# Patient Record
Sex: Male | Born: 1965 | Race: Asian | Hispanic: No | Smoking: Former smoker
Health system: Southern US, Community
[De-identification: ages and names within clinical notes are randomized; demographics above are authoritative.]

## PROBLEM LIST (undated history)

## (undated) DIAGNOSIS — S53104A Unspecified dislocation of right ulnohumeral joint, initial encounter: Secondary | ICD-10-CM

## (undated) DIAGNOSIS — S45101A Unspecified injury of brachial artery, right side, initial encounter: Secondary | ICD-10-CM

---

## 2000-12-07 ENCOUNTER — Encounter: Admission: RE | Admit: 2000-12-07 | Discharge: 2001-03-07 | Payer: Self-pay | Admitting: Emergency Medicine

## 2001-01-11 ENCOUNTER — Ambulatory Visit (HOSPITAL_COMMUNITY): Admission: RE | Admit: 2001-01-11 | Discharge: 2001-01-11 | Payer: Self-pay | Admitting: *Deleted

## 2014-10-23 ENCOUNTER — Ambulatory Visit (INDEPENDENT_AMBULATORY_CARE_PROVIDER_SITE_OTHER): Payer: 59 | Admitting: Emergency Medicine

## 2014-10-23 VITALS — BP 120/80 | HR 98 | Temp 98.0°F | Resp 16 | Ht 67.0 in | Wt 117.0 lb

## 2014-10-23 DIAGNOSIS — B001 Herpesviral vesicular dermatitis: Secondary | ICD-10-CM

## 2014-10-23 DIAGNOSIS — Z1211 Encounter for screening for malignant neoplasm of colon: Secondary | ICD-10-CM

## 2014-10-23 DIAGNOSIS — F524 Premature ejaculation: Secondary | ICD-10-CM

## 2014-10-23 DIAGNOSIS — Z0000 Encounter for general adult medical examination without abnormal findings: Secondary | ICD-10-CM

## 2014-10-23 LAB — CBC WITH DIFFERENTIAL/PLATELET
Basophils Absolute: 0.2 10*3/uL — ABNORMAL HIGH (ref 0.0–0.1)
Basophils Relative: 2 % — ABNORMAL HIGH (ref 0–1)
Eosinophils Absolute: 1 10*3/uL — ABNORMAL HIGH (ref 0.0–0.7)
Eosinophils Relative: 13 % — ABNORMAL HIGH (ref 0–5)
HCT: 40.1 % (ref 39.0–52.0)
Hemoglobin: 13.3 g/dL (ref 13.0–17.0)
Lymphocytes Relative: 37 % (ref 12–46)
Lymphs Abs: 2.8 10*3/uL (ref 0.7–4.0)
MCH: 26.9 pg (ref 26.0–34.0)
MCHC: 33.2 g/dL (ref 30.0–36.0)
MCV: 81.2 fL (ref 78.0–100.0)
MPV: 10.2 fL (ref 8.6–12.4)
Monocytes Absolute: 0.9 10*3/uL (ref 0.1–1.0)
Monocytes Relative: 12 % (ref 3–12)
Neutro Abs: 2.7 10*3/uL (ref 1.7–7.7)
Neutrophils Relative %: 36 % — ABNORMAL LOW (ref 43–77)
Platelets: 345 10*3/uL (ref 150–400)
RBC: 4.94 MIL/uL (ref 4.22–5.81)
RDW: 13.7 % (ref 11.5–15.5)
WBC: 7.5 10*3/uL (ref 4.0–10.5)

## 2014-10-23 LAB — LIPID PANEL
Cholesterol: 218 mg/dL — ABNORMAL HIGH (ref 0–200)
HDL: 46 mg/dL (ref >=40)
LDL Cholesterol: 152 mg/dL — ABNORMAL HIGH (ref 0–99)
Total CHOL/HDL Ratio: 4.7 Ratio
Triglycerides: 100 mg/dL (ref <150)
VLDL: 20 mg/dL (ref 0–40)

## 2014-10-23 LAB — COMPREHENSIVE METABOLIC PANEL
ALT: 15 U/L (ref 0–53)
AST: 23 U/L (ref 0–37)
Albumin: 4.6 g/dL (ref 3.5–5.2)
Alkaline Phosphatase: 67 U/L (ref 39–117)
BUN: 15 mg/dL (ref 6–23)
CO2: 26 mEq/L (ref 19–32)
Calcium: 9.9 mg/dL (ref 8.4–10.5)
Chloride: 105 mEq/L (ref 96–112)
Creat: 0.88 mg/dL (ref 0.50–1.35)
Glucose, Bld: 100 mg/dL — ABNORMAL HIGH (ref 70–99)
Potassium: 4.6 mEq/L (ref 3.5–5.3)
Sodium: 142 mEq/L (ref 135–145)
Total Bilirubin: 0.5 mg/dL (ref 0.2–1.2)
Total Protein: 8.3 g/dL (ref 6.0–8.3)

## 2014-10-23 LAB — POCT URINALYSIS DIPSTICK
Bilirubin, UA: NEGATIVE
Blood, UA: NEGATIVE
Glucose, UA: NEGATIVE
Ketones, UA: 15
Leukocytes, UA: NEGATIVE
Nitrite, UA: NEGATIVE
Spec Grav, UA: 1.025
Urobilinogen, UA: 0.2
pH, UA: 5.5

## 2014-10-23 LAB — TSH: TSH: 0.115 u[IU]/mL — ABNORMAL LOW (ref 0.350–4.500)

## 2014-10-23 MED ORDER — PAROXETINE HCL 20 MG PO TABS: 20.0000 mg | ORAL_TABLET | Freq: Every day | ORAL | Status: DC

## 2014-10-23 MED ORDER — VALACYCLOVIR HCL 1 G PO TABS: 1000.0000 mg | ORAL_TABLET | Freq: Every day | ORAL | Status: DC

## 2014-10-23 NOTE — Patient Instructions (Signed)
Cold Sore  A cold sore (fever blister) is a skin infection caused by the herpes simplex virus (HSV-1). HSV-1 is closely related to the virus that causes genital herpes (HSV-2), but they are not the same even though both viruses can cause oral and genital infections. Cold sores are small, fluid-filled sores inside of the mouth or on the lips, gums, nose, chin, cheeks, or fingers.   The herpes simplex virus can be easily passed (contagious) to other people through close personal contact, such as kissing or sharing personal items. The virus can also spread to other parts of the body, such as the eyes or genitals. Cold sores are contagious until the sores crust over completely. They often heal within 2 weeks.   Once a person is infected, the herpes simplex virus remains permanently in the body. Therefore, there is no cure for cold sores, and they often recur when a person is tired, stressed, sick, or gets too much sun. Additional factors that can cause a recurrence include hormone changes in menstruation or pregnancy, certain drugs, and cold weather.   CAUSES   Cold sores are caused by the herpes simplex virus. The virus is spread from person to person through close contact, such as through kissing, touching the affected area, or sharing personal items such as lip balm, razors, or eating utensils.   SYMPTOMS   The first infection may not cause symptoms. If symptoms develop, the symptoms often go through different stages. Here is how a cold sore develops:   · Tingling, itching, or burning is felt 1-2 days before the outbreak.    · Fluid-filled blisters appear on the lips, inside the mouth, nose, or on the cheeks.    · The blisters start to ooze clear fluid.    · The blisters dry up and a yellow crust appears in its place.    · The crust falls off.    Symptoms depend on whether it is the initial outbreak or a recurrence. Some other symptoms with the first outbreak may include:   · Fever.    · Sore throat.    · Headache.     · Muscle aches.    · Swollen neck glands.    DIAGNOSIS   A diagnosis is often made based on your symptoms and looking at the sores. Sometimes, a sore may be swabbed and then examined in the lab to make a final diagnosis. If the sores are not present, blood tests can find the herpes simplex virus.   TREATMENT   There is no cure for cold sores and no vaccine for the herpes simplex virus. Within 2 weeks, most cold sores go away on their own without treatment. Medicines cannot make the infection go away, but medicine can help relieve some of the pain associated with the sores, can work to stop the virus from multiplying, and can also shorten healing time. Medicine may be in the form of creams, gels, pills, or a shot.   HOME CARE INSTRUCTIONS   · Only take over-the-counter or prescription medicines for pain, discomfort, or fever as directed by your caregiver. Do not use aspirin.    · Use a cotton-tip swab to apply creams or gels to your sores.    · Do not touch the sores or pick the scabs. Wash your hands often. Do not touch your eyes without washing your hands first.    · Avoid kissing, oral sex, and sharing personal items until sores heal.    · Apply an ice pack on your sores for 10-15 minutes to ease any discomfort.    ·   Avoid hot, cold, or salty foods because they may hurt your mouth. Eat a soft, bland diet to avoid irritating the sores. Use a straw to drink if you have pain when drinking out of a glass.    · Keep sores clean and dry to prevent an infection of other tissues.    · Avoid the sun and limit stress if these things trigger outbreaks. If sun causes cold sores, apply sunscreen on the lips before being out in the sun.    SEEK MEDICAL CARE IF:   · You have a fever or persistent symptoms for more than 2-3 days.    · You have a fever and your symptoms suddenly get worse.    · You have pus, not clear fluid, coming from the sores.    · You have redness that is spreading.    · You have pain or irritation in your  eye.    · You get sores on your genitals.    · Your sores do not heal within 2 weeks.    · You have a weakened immune system.    · You have frequent recurrences of cold sores.    MAKE SURE YOU:   · Understand these instructions.  · Will watch your condition.  · Will get help right away if you are not doing well or get worse.  Document Released: 08/14/2000 Document Revised: 01/01/2014 Document Reviewed: 12/30/2011  ExitCare® Patient Information ©2015 [employer]. This information is not intended to replace advice given to you by your health care provider. Make sure you discuss any questions you have with your health care provider.

## 2014-10-23 NOTE — Progress Notes (Signed)
[facility] [address], [place] [id number] [phone]  Date:  10/23/2014   Name:  Daniel Stewart   DOB:  [birth year]   MRN:  [id number]  PCP:  No primary care provider on file.    Chief Complaint: Annual Exam and Mouth Lesions   History of Present Illness:  Daniel Stewart is a 49 y.o. very pleasant male patient who presents with the following:  For routine physical Only current complaint is oral ulcer that has recurrent since he stopped smoking. Needs colon cancer screening. Has premature ejaculation   There are no active problems to display for this patient.   History reviewed. No pertinent past medical history.  History reviewed. No pertinent past surgical history.  History  Substance Use Topics  . Smoking status: Former Games developermoker  . Smokeless tobacco: Not on file  . Alcohol Use: Not on file    Family History  Problem Relation Age of Onset  . Diabetes Mother   . Cancer Father     No Known Allergies  Medication list has been reviewed and updated.  No current outpatient prescriptions on file prior to visit.   No current facility-administered medications on file prior to visit.    Review of Systems:  As per HPI, otherwise negative.  Marland Kitchen.  Physical Examination: Filed Vitals:   10/23/14 0923  BP: 120/80  Pulse: 98  Temp: 98 F (36.7 C)  Resp: 16   Filed Vitals:   10/23/14 0923  Height: 5\' 7"  (1.702 m)  Weight: 117 lb (53.071 kg)   Body mass index is 18.32 kg/(m^2). Ideal Body Weight: Weight in (lb) to have BMI = 25: 159.3  GEN: WDWN, NAD, Non-toxic, A & O x 3 HEENT: Atraumatic, Normocephalic. Neck supple. No masses, No LAD. Ears and Nose: No external deformity. CV: RRR, No M/G/R. No JVD. No thrill. No extra heart sounds. PULM: CTA B, no wheezes, crackles, rhonchi. No retractions. No resp. distress. No accessory muscle use. ABD: S, NT, ND, +BS. No rebound. No HSM. EXTR: No c/c/e NEURO Normal gait.   PSYCH: Normally interactive. Conversant. Not depressed or anxious appearing.  Calm demeanor.    Assessment and Plan: Annual physical Colon screening Premature ejaculation Cold sores  Signed,  Phillips OdorJeffery Vandora Jaskulski, MD

## 2014-10-23 NOTE — Addendum Note (Signed)
Addended by: Johnnette LitterARDWELL, Mehar Kirkwood M on: 10/23/2014 10:08 AM   Modules accepted: Kipp BroodSmartSet

## 2014-10-24 LAB — PSA: PSA: 0.98 ng/mL (ref <=4.00)

## 2014-11-26 ENCOUNTER — Encounter: Payer: Self-pay | Admitting: Internal Medicine

## 2015-03-02 ENCOUNTER — Ambulatory Visit (INDEPENDENT_AMBULATORY_CARE_PROVIDER_SITE_OTHER): Payer: 59 | Admitting: Physician Assistant

## 2015-03-02 VITALS — BP 126/82 | HR 78 | Temp 97.8°F | Resp 17 | Ht 68.0 in | Wt 119.8 lb

## 2015-03-02 DIAGNOSIS — Z760 Encounter for issue of repeat prescription: Secondary | ICD-10-CM

## 2015-03-02 MED ORDER — VALACYCLOVIR HCL 500 MG PO TABS: 500.0000 mg | ORAL_TABLET | Freq: Every day | ORAL | Status: DC

## 2015-03-02 NOTE — Progress Notes (Signed)
   03/02/2015 at 11:21 AM  Daniel Stewart / DOB: [birth year] / MRN: [id number]  The patient  does not have a problem list on file.  SUBJECTIVE  Chief complaint: Follow-up and Medication Refill  Patient here for medication refills today and he denies any complaints.  He reports he has been taking Valacyclovir for oral lesions, and says that this helps him a great deal, as when he is taking the medication he has no oral lesions.  He would like this refilled today.    He  has no past medical history on file.    Medications reviewed and updated by myself where necessary, and exist elsewhere in the encounter.   Daniel Stewart has No Known Allergies. He  reports that he has quit smoking. He does not have any smokeless tobacco history on file. He  has no sexual activity history on file. The patient  has no past surgical history on file.  His family history includes Cancer in his father; Diabetes in his mother.  Review of Systems  Constitutional: Negative for fever.  Skin: Negative for rash.  Neurological: Negative for dizziness and headaches.    OBJECTIVE  His  height is 5\' 8"  (1.727 m) and weight is 119 lb 12.8 oz (54.341 kg). His oral temperature is 97.8 F (36.6 C). His blood pressure is 126/82 and his pulse is 78. His respiration is 17 and oxygen saturation is 98%.  The patient's body mass index is 18.22 kg/(m^2).  Physical Exam  Constitutional: Vital signs are normal. He appears well-developed and well-nourished. No distress.  Skin: He is not diaphoretic.    No results found for this or any previous visit (from the past 24 hour(s)).  ASSESSMENT & PLAN  Daniel Stewart was seen today for follow-up and medication refill.  Diagnoses and all orders for this visit:  Medication refill Orders: -     valACYclovir (VALTREX) 500 MG tablet; Take 1 tablet (500 mg total) by mouth daily.   The patient was advised to call or come back to clinic if he does not see an improvement in  symptoms, or worsens with the above plan.   Deliah BostonMichael Clark, MHS, PA-C [facility] 03/02/2015 11:21 AM

## 2015-07-18 ENCOUNTER — Other Ambulatory Visit: Payer: Self-pay | Admitting: Physician Assistant

## 2015-07-18 ENCOUNTER — Telehealth: Payer: Self-pay

## 2015-07-18 MED ORDER — ACYCLOVIR 200 MG PO CAPS: 200.0000 mg | ORAL_CAPSULE | Freq: Three times a day (TID) | ORAL | Status: DC

## 2015-07-18 NOTE — Telephone Encounter (Signed)
Acyclovir written and sent.  Thanks Britta MccreedyBarbara.  Deliah BostonMichael Lester Crickenberger, MS, PA-C 10:30 AM, 07/18/2015

## 2015-07-18 NOTE — Telephone Encounter (Signed)
Pharm faxed notice that pt's ins is not covering valtrex, but will cover acyclovir. Casimiro NeedleMichael, do you want to write this for him to try?

## 2015-11-06 ENCOUNTER — Ambulatory Visit (INDEPENDENT_AMBULATORY_CARE_PROVIDER_SITE_OTHER): Payer: 59 | Admitting: Family Medicine

## 2015-11-06 VITALS — BP 130/70 | HR 102 | Temp 99.4°F | Resp 14 | Ht 68.0 in | Wt 119.0 lb

## 2015-11-06 DIAGNOSIS — J111 Influenza due to unidentified influenza virus with other respiratory manifestations: Secondary | ICD-10-CM

## 2015-11-06 DIAGNOSIS — K120 Recurrent oral aphthae: Secondary | ICD-10-CM | POA: Diagnosis not present

## 2015-11-06 DIAGNOSIS — K1379 Other lesions of oral mucosa: Secondary | ICD-10-CM

## 2015-11-06 MED ORDER — OSELTAMIVIR PHOSPHATE 75 MG PO CAPS: 75.0000 mg | ORAL_CAPSULE | Freq: Two times a day (BID) | ORAL | Status: DC

## 2015-11-06 MED ORDER — HYDROCOD POLST-CPM POLST ER 10-8 MG/5ML PO SUER: 5.0000 mL | Freq: Two times a day (BID) | ORAL | Status: DC | PRN

## 2015-11-06 MED ORDER — MAGIC MOUTHWASH W/LIDOCAINE: 10.0000 mL | ORAL | Status: DC | PRN

## 2015-11-06 MED ORDER — CHLORHEXIDINE GLUCONATE 0.12 % MT SOLN: 15.0000 mL | Freq: Four times a day (QID) | OROMUCOSAL | Status: DC

## 2015-11-06 MED ORDER — LIDOCAINE VISCOUS 2 % MT SOLN: 5.0000 mL | OROMUCOSAL | Status: DC | PRN

## 2015-11-06 MED ORDER — IPRATROPIUM BROMIDE 0.03 % NA SOLN: 2.0000 | Freq: Four times a day (QID) | NASAL | Status: DC

## 2015-11-06 NOTE — Progress Notes (Signed)
Subjective:  By signing my name below, I, Daniel Stewart, attest that this documentation has been prepared under the direction and in the presence of Norberto Sorenson, MD Electronically Signed: Charline Bills, ED Scribe 11/06/2015 at 11:00 AM.   Patient ID: Daniel Stewart, male    DOB: 05-15-1966, 50 y.o.   MRN: [id number] Chief Complaint  Patient presents with  . Cough    started yesterday  . Chills  . Generalized Body Aches   HPI  HPI Comments: Daniel Stewart is a 50 y.o. male who [facility] complaining of dry cough onset yesterday. Pt states that cough wakes him at night.Pt reports associated fever, chills, rhinorrhea, generalized body aches. Triage temperature 99.4 F. He has tried Theraflu without significant relief. Pt states influenza is going around at work.   Recurrent Mouth Sores Pt was seen in the office on 10/23/14, diagnosed with cold sores and started on valtrex and switch to acyclovir. He states that he has been compliant with medications but has noticed tender recurrent mouth sores. Pain is exacerbated with palpation. Pt states that he has not been seen by a dentist in a while.   History reviewed. No pertinent past medical history. Current Outpatient Prescriptions on File Prior to Visit  Medication Sig Dispense Refill  . acyclovir (ZOVIRAX) 200 MG capsule Take 1 capsule (200 mg total) by mouth 3 (three) times daily. Use at the first sign of symptoms. 60 capsule 6  . PARoxetine (PAXIL) 20 MG tablet Take 1 tablet (20 mg total) by mouth daily. 30 tablet 12  . valACYclovir (VALTREX) 500 MG tablet Take 1 tablet (500 mg total) by mouth daily. 90 tablet 4   No current facility-administered medications on file prior to visit.   No Known Allergies  Review of Systems  Constitutional: Positive for fever and chills.  HENT: Positive for congestion, mouth sores and rhinorrhea.   Respiratory: Positive for cough.   Gastrointestinal:  Negative for vomiting, abdominal pain and diarrhea.  Musculoskeletal: Positive for myalgias and arthralgias. Negative for gait problem.  Skin: Positive for wound. Negative for pallor.  Neurological: Negative for weakness.  Hematological: Negative for adenopathy.  Psychiatric/Behavioral: Positive for sleep disturbance.    BP 130/70 mmHg  Pulse 102  Temp(Src) 99.4 F (37.4 C) (Oral)  Resp 14  Ht  (1.727 m)  Wt 119 lb (53.978 kg)  BMI 18.10 kg/m2  SpO2 98%    Objective:   Physical Exam  Constitutional: He is oriented to person, place, and time. He appears well-developed and well-nourished. No distress.  HENT:  Head: Normocephalic and atraumatic.  Right Ear: Tympanic membrane is injected.  Left Ear: Tympanic membrane is injected.  Nose: Nose normal.  Mouth/Throat: Posterior oropharyngeal erythema (bright red) present.  Shaggy, very tender shallow ulceration with ragged edges and a white base inside gums underneath tongue.   Eyes: Conjunctivae and EOM are normal.  Neck: Neck supple. No tracheal deviation present.  Cardiovascular: Normal rate, regular rhythm, S1 normal, S2 normal and normal heart sounds.   No murmur heard. Pulmonary/Chest: Effort normal. No respiratory distress.  Musculoskeletal: Normal range of motion.  Lymphadenopathy:       Head (right side): No submandibular adenopathy present.       Head (left side): No submandibular adenopathy present.    He has no cervical adenopathy.       Right: No supraclavicular adenopathy present.       Left: No supraclavicular adenopathy present.  Neurological: He is alert and oriented to person, place, and time.  Skin: Skin is warm. He is diaphoretic.  Psychiatric: He has a normal mood and affect. His behavior is normal.  Nursing note and vitals reviewed.     Assessment & Plan:   1. Influenza   2. Aphthous ulcer   3. Mouth sore - stop antiviral - suspect sxs are aphthous rather than herpetic and no sig improvement in sxs  while on it.  Follow up with dentist, oral hygeine.    Orders Placed This Encounter  Procedures  . Herpes simplex virus culture    Order Specific Question:  Source    Answer:  inner lower gums, mouth    Meds ordered this encounter  Medications  . oseltamivir (TAMIFLU) 75 MG capsule    Sig: Take 1 capsule (75 mg total) by mouth 2 (two) times daily.    Dispense:  10 capsule    Refill:  0  . chlorpheniramine-HYDROcodone (TUSSIONEX PENNKINETIC ER) 10-8 MG/5ML SUER    Sig: Take 5 mLs by mouth every 12 (twelve) hours as needed.    Dispense:  120 mL    Refill:  0  . ipratropium (ATROVENT) 0.03 % nasal spray    Sig: Place 2 sprays into the nose 4 (four) times daily.    Dispense:  30 mL    Refill:  1  . DISCONTD: magic mouthwash w/lidocaine SOLN    Sig: Take 10 mLs by mouth every 2 (two) hours as needed for mouth pain. Swish, gargle, and split    Dispense:  360 mL    Refill:  0    Please mix 1:1:1 ratio of viscous lidocaine: diphenhydramine: maalox  . lidocaine (XYLOCAINE) 2 % solution    Sig: Use as directed 5 mLs in the mouth or throat every 2 (two) hours as needed for mouth pain.    Dispense:  100 mL    Refill:  0  . chlorhexidine (PERIDEX) 0.12 % solution    Sig: Use as directed 15 mLs in the mouth or throat 4 (four) times daily.    Dispense:  473 mL    Refill:  0    I personally performed the services described in this documentation, which was scribed in my presence. The recorded information has been reviewed and considered, and addended by me as needed.  Norberto SorensonEva Maritta Kief, MD MPH

## 2015-11-06 NOTE — Patient Instructions (Signed)
Canker Sores Canker sores are small, painful sores that develop inside your mouth. They may also be called aphthous ulcers. You can get canker sores on the inside of your lips or cheeks, on your tongue, or anywhere inside your mouth. You can have just one canker sore or several of them. Canker sores cannot be passed from one person to another (noncontagious). These sores are different than the sores that you may get on the outside of your lips (cold sores or fever blisters). Canker sores usually start as painful red bumps. Then they turn into small white, yellow, or gray ulcers that have red borders. The ulcers may be quite painful. The pain may be worse when you eat or drink. CAUSES The cause of this condition is not known. RISK FACTORS This condition is more likely to develop in:  Women.  People in their teens or 92s.  Women who are having their menstrual period.  People who are under a lot of emotional stress.  People who do not get enough iron or B vitamins.  People who have poor oral hygiene.  People who have an injury inside the mouth. This can happen after having dental work or from chewing something hard. SYMPTOMS Along with the canker sore, symptoms may also include:  Fever.  Fatigue.  Swollen lymph nodes in your neck. DIAGNOSIS This condition can be diagnosed based on your symptoms. Your health care provider will also examine your mouth. Your health care provider may also do tests if you get canker sores often or if they are very bad. Tests may include:  Blood tests to rule out other causes of canker sores.  Taking swabs from the sore to check for infection.  Taking a small piece of skin from the sore (biopsy) to test it for cancer. TREATMENT Most canker sores clear up without treatment in about 10 days. Home care is usually the only treatment that you will need. Over-the-counter medicines can relieve discomfort.If you have severe canker sores, your health care  provider may prescribe:  Numbing ointment to relieve pain.  Vitamins.  Steroid medicines. These may be given as:  Oral pills.  Mouth rinses.  Gels.  Antibiotic mouth rinse. HOME CARE INSTRUCTIONS  Apply, take, or use medicines only as directed by your health care provider. These include vitamins.  If you were prescribed an antibiotic mouth rinse, finish all of it even if you start to feel better.  Until the sores are healed:  Do not drink coffee or citrus juices.  Do not eat spicy or salty foods.  Use a mild, over-the-counter mouth rinse as directed by your health care provider.  Practice good oral hygiene.  Floss your teeth every day.  Brush your teeth with a soft brush twice each day. SEEK MEDICAL CARE IF:  Your symptoms do not get better after two weeks.  You also have a fever or swollen glands.  You get canker sores often.  You have a canker sore that is getting larger.  You cannot eat or drink due to your canker sores.   This information is not intended to replace advice given to you by your health care provider. Make sure you discuss any questions you have with your health care provider.   Document Released: 12/12/2010 Document Revised: 01/01/2015 Document Reviewed: 07/18/2014 Elsevier Interactive Patient Education 2016 [employer]  Influenza, Adult Influenza ("the flu") is a viral infection of the respiratory tract. It occurs more often in winter months because people spend more time in  close contact with one another. Influenza can make you feel very sick. Influenza easily spreads from person to person (contagious). CAUSES  Influenza is caused by a virus that infects the respiratory tract. You can catch the virus by breathing in droplets from an infected person's cough or sneeze. You can also catch the virus by touching something that was recently contaminated with the virus and then touching your mouth, nose, or eyes. RISKS AND COMPLICATIONS You may  be at risk for a more severe case of influenza if you smoke cigarettes, have diabetes, have chronic heart disease (such as heart failure) or lung disease (such as asthma), or if you have a weakened immune system. Elderly people and pregnant women are also at risk for more serious infections. The most common problem of influenza is a lung infection (pneumonia). Sometimes, this problem can require emergency medical care and may be life threatening. SIGNS AND SYMPTOMS  Symptoms typically last 4 to 10 days and may include:  Fever.  Chills.  Headache, body aches, and muscle aches.  Sore throat.  Chest discomfort and cough.  Poor appetite.  Weakness or feeling tired.  Dizziness.  Nausea or vomiting. DIAGNOSIS  Diagnosis of influenza is often made based on your history and a physical exam. A nose or throat swab test can be done to confirm the diagnosis. TREATMENT  In mild cases, influenza goes away on its own. Treatment is directed at relieving symptoms. For more severe cases, your health care provider may prescribe antiviral medicines to shorten the sickness. Antibiotic medicines are not effective because the infection is caused by a virus, not by bacteria. HOME CARE INSTRUCTIONS  Take medicines only as directed by your health care provider.  Use a cool mist humidifier to make breathing easier.  Get plenty of rest until your temperature returns to normal. This usually takes 3 to 4 days.  Drink enough fluid to keep your urine clear or pale yellow.  Cover yourmouth and nosewhen coughing or sneezing,and wash your handswellto prevent thevirusfrom spreading.  Stay homefromwork orschool untilthe fever is gonefor at least 321full day. PREVENTION  An annual influenza vaccination (flu shot) is the best way to avoid getting influenza. An annual flu shot is now routinely recommended for all adults in the [place] SEEK MEDICAL CARE IF:  You experiencechest pain, yourcough worsens,or  you producemore mucus.  Youhave nausea,vomiting, ordiarrhea.  Your fever returns or gets worse. SEEK IMMEDIATE MEDICAL CARE IF:  You havetrouble breathing, you become short of breath,or your skin ornails becomebluish.  You have severe painor stiffnessin the neck.  You develop a sudden headache, or pain in the face or ear.  You have nausea or vomiting that you cannot control. MAKE SURE YOU:   Understand these instructions.  Will watch your condition.  Will get help right away if you are not doing well or get worse.   This information is not intended to replace advice given to you by your health care provider. Make sure you discuss any questions you have with your health care provider.   Document Released: 08/14/2000 Document Revised: 09/07/2014 Document Reviewed: 11/16/2011 Elsevier Interactive Patient Education [employer] Inc.

## 2015-11-08 LAB — HERPES SIMPLEX VIRUS CULTURE: ORGANISM ID, BACTERIA: NOT DETECTED

## 2015-11-19 ENCOUNTER — Encounter: Payer: Self-pay | Admitting: Family Medicine

## 2015-11-20 ENCOUNTER — Other Ambulatory Visit: Payer: Self-pay

## 2015-11-20 MED ORDER — PAROXETINE HCL 20 MG PO TABS: 20.0000 mg | ORAL_TABLET | Freq: Every day | ORAL | Status: DC

## 2016-02-29 ENCOUNTER — Ambulatory Visit (INDEPENDENT_AMBULATORY_CARE_PROVIDER_SITE_OTHER): Payer: 59

## 2016-02-29 ENCOUNTER — Ambulatory Visit (INDEPENDENT_AMBULATORY_CARE_PROVIDER_SITE_OTHER): Payer: 59 | Admitting: Family Medicine

## 2016-02-29 VITALS — BP 120/70 | HR 84 | Temp 98.7°F | Resp 16 | Ht 66.5 in | Wt 120.0 lb

## 2016-02-29 DIAGNOSIS — S46012A Strain of muscle(s) and tendon(s) of the rotator cuff of left shoulder, initial encounter: Secondary | ICD-10-CM | POA: Diagnosis not present

## 2016-02-29 DIAGNOSIS — M25512 Pain in left shoulder: Secondary | ICD-10-CM | POA: Diagnosis not present

## 2016-02-29 DIAGNOSIS — M7542 Impingement syndrome of left shoulder: Secondary | ICD-10-CM | POA: Diagnosis not present

## 2016-02-29 MED ORDER — TRIAMCINOLONE ACETONIDE 40 MG/ML IJ SUSP
40.0000 mg | Freq: Once | INTRAMUSCULAR | Status: AC
  Administered 2016-02-29: 40 mg via INTRA_ARTICULAR

## 2016-02-29 MED ORDER — INDOMETHACIN 50 MG PO CAPS: 50.0000 mg | ORAL_CAPSULE | Freq: Two times a day (BID) | ORAL | Status: DC

## 2016-02-29 NOTE — Patient Instructions (Addendum)
   IF you received an x-ray today, you will receive an invoice from [facility]. Please contact [facility] at [phone] with questions or concerns regarding your invoice.   IF you received labwork today, you will receive an invoice from [facility]. Please contact [facility] at [phone] with questions or concerns regarding your invoice.   Our billing staff will not be able to assist you with questions regarding bills from these companies.  You will be contacted with the lab results as soon as they are available. The fastest way to get your results is to activate your My Chart account. Instructions are located on the last page of this paperwork. If you have not heard from us regarding the results in 2 weeks, please contact this office.    Impingement Syndrome, Rotator Cuff, Bursitis With Rehab Impingement syndrome is a condition that involves inflammation of the tendons of the rotator cuff and the subacromial bursa, that causes pain in the shoulder. The rotator cuff consists of four tendons and muscles that control much of the shoulder and upper arm function. The subacromial bursa is a fluid filled sac that helps reduce friction between the rotator cuff and one of the bones of the shoulder (acromion). Impingement syndrome is usually an overuse injury that causes swelling of the bursa (bursitis), swelling of the tendon (tendonitis), and/or a tear of the tendon (strain). Strains are classified into three categories. Grade 1 strains cause pain, but the tendon is not lengthened. Grade 2 strains include a lengthened ligament, due to the ligament being stretched or partially ruptured. With grade 2 strains there is still function, although the function may be decreased. Grade 3 strains include a complete tear of the tendon or muscle, and function is usually impaired. SYMPTOMS   Pain around the shoulder, often at the outer portion of the upper arm.  Pain  that gets worse with shoulder function, especially when reaching overhead or lifting.  Sometimes, aching when not using the arm.  Pain that wakes you up at night.  Sometimes, tenderness, swelling, warmth, or redness over the affected area.  Loss of strength.  Limited motion of the shoulder, especially reaching behind the back (to the back pocket or to unhook bra) or across your body.  Crackling sound (crepitation) when moving the arm.  Biceps tendon pain and inflammation (in the front of the shoulder). Worse when bending the elbow or lifting. CAUSES  Impingement syndrome is often an overuse injury, in which chronic (repetitive) motions cause the tendons or bursa to become inflamed. A strain occurs when a force is paced on the tendon or muscle that is greater than it can withstand. Common mechanisms of injury include: Stress from sudden increase in duration, frequency, or intensity of training.  Direct hit (trauma) to the shoulder.  Aging, erosion of the tendon with normal use.  Bony bump on shoulder (acromial spur). RISK INCREASES WITH:  Contact sports (football, wrestling, boxing).  Throwing sports (baseball, tennis, volleyball).  Weightlifting and bodybuilding.  Heavy labor.  Previous injury to the rotator cuff, including impingement.  Poor shoulder strength and flexibility.  Failure to warm up properly before activity.  Inadequate protective equipment.  Old age.  Bony bump on shoulder (acromial spur). PREVENTION   Warm up and stretch properly before activity.  Allow for adequate recovery between workouts.  Maintain physical fitness:  Strength, flexibility, and endurance.  Cardiovascular fitness.  Learn and use proper exercise technique. PROGNOSIS  If treated properly, impingement syndrome usually goes   away within 6 weeks. Sometimes surgery is required.  RELATED COMPLICATIONS   Longer healing time if not properly treated, or if not given enough time to  heal.  Recurring symptoms, that result in a chronic condition.  Shoulder stiffness, frozen shoulder, or loss of motion.  Rotator cuff tendon tear.  Recurring symptoms, especially if activity is resumed too soon, with overuse, with a direct blow, or when using poor technique. TREATMENT  Treatment first involves the use of ice and medicine, to reduce pain and inflammation. The use of strengthening and stretching exercises may help reduce pain with activity. These exercises may be performed at home or with a therapist. If non-surgical treatment is unsuccessful after more than 6 months, surgery may be advised. After surgery and rehabilitation, activity is usually possible in 3 months.  MEDICATION  If pain medicine is needed, nonsteroidal anti-inflammatory medicines (aspirin and ibuprofen), or other minor pain relievers (acetaminophen), are often advised.  Do not take pain medicine for 7 days before surgery.  Prescription pain relievers may be given, if your caregiver thinks they are needed. Use only as directed and only as much as you need.  Corticosteroid injections may be given by your caregiver. These injections should be reserved for the most serious cases, because they may only be given a certain number of times. HEAT AND COLD  Cold treatment (icing) should be applied for 10 to 15 minutes every 2 to 3 hours for inflammation and pain, and immediately after activity that aggravates your symptoms. Use ice packs or an ice massage.  Heat treatment may be used before performing stretching and strengthening activities prescribed by your caregiver, physical therapist, or athletic trainer. Use a heat pack or a warm water soak. SEEK MEDICAL CARE IF:   Symptoms get worse or do not improve in 4 to 6 weeks, despite treatment.  New, unexplained symptoms develop. (Drugs used in treatment may produce side effects.) EXERCISES  RANGE OF MOTION (ROM) AND STRETCHING EXERCISES - Impingement Syndrome  (Rotator Cuff  Tendinitis, Bursitis) These exercises may help you when beginning to rehabilitate your injury. Your symptoms may go away with or without further involvement from your physician, physical therapist or athletic trainer. While completing these exercises, remember:   Restoring tissue flexibility helps normal motion to return to the joints. This allows healthier, less painful movement and activity.  An effective stretch should be held for at least 30 seconds.  A stretch should never be painful. You should only feel a gentle lengthening or release in the stretched tissue. STRETCH - Flexion, Standing  Stand with good posture. With an underhand grip on your right / left hand, and an overhand grip on the opposite hand, grasp a broomstick or cane so that your hands are a little more than shoulder width apart.  Keeping your right / left elbow straight and shoulder muscles relaxed, push the stick with your opposite hand, to raise your right / left arm in front of your body and then overhead. Raise your arm until you feel a stretch in your right / left shoulder, but before you have increased shoulder pain.  Try to avoid shrugging your right / left shoulder as your arm rises, by keeping your shoulder blade tucked down and toward your mid-back spine. Hold for __________ seconds.  Slowly return to the starting position. Repeat __________ times. Complete this exercise __________ times per day. STRETCH - Abduction, Supine  Lie on your back. With an underhand grip on your right / left hand and   an overhand grip on the opposite hand, grasp a broomstick or cane so that your hands are a little more than shoulder width apart.  Keeping your right / left elbow straight and your shoulder muscles relaxed, push the stick with your opposite hand, to raise your right / left arm out to the side of your body and then overhead. Raise your arm until you feel a stretch in your right / left shoulder, but before you  have increased shoulder pain.  Try to avoid shrugging your right / left shoulder as your arm rises, by keeping your shoulder blade tucked down and toward your mid-back spine. Hold for __________ seconds.  Slowly return to the starting position. Repeat __________ times. Complete this exercise __________ times per day. ROM - Flexion, Active-Assisted  Lie on your back. You may bend your knees for comfort.  Grasp a broomstick or cane so your hands are about shoulder width apart. Your right / left hand should grip the end of the stick, so that your hand is positioned "thumbs-up," as if you were about to shake hands.  Using your healthy arm to lead, raise your right / left arm overhead, until you feel a gentle stretch in your shoulder. Hold for __________ seconds.  Use the stick to assist in returning your right / left arm to its starting position. Repeat __________ times. Complete this exercise __________ times per day.  ROM - Internal Rotation, Supine   Lie on your back on a firm surface. Place your right / left elbow about 60 degrees away from your side. Elevate your elbow with a folded towel, so that the elbow and shoulder are the same height.  Using a broomstick or cane and your strong arm, pull your right / left hand toward your body until you feel a gentle stretch, but no increase in your shoulder pain. Keep your shoulder and elbow in place throughout the exercise.  Hold for __________ seconds. Slowly return to the starting position. Repeat __________ times. Complete this exercise __________ times per day. STRETCH - Internal Rotation  Place your right / left hand behind your back, palm up.  Throw a towel or belt over your opposite shoulder. Grasp the towel with your right / left hand.  While keeping an upright posture, gently pull up on the towel, until you feel a stretch in the front of your right / left shoulder.  Avoid shrugging your right / left shoulder as your arm rises, by  keeping your shoulder blade tucked down and toward your mid-back spine.  Hold for __________ seconds. Release the stretch, by lowering your healthy hand. Repeat __________ times. Complete this exercise __________ times per day. ROM - Internal Rotation   Using an underhand grip, grasp a stick behind your back with both hands.  While standing upright with good posture, slide the stick up your back until you feel a mild stretch in the front of your shoulder.  Hold for __________ seconds. Slowly return to your starting position. Repeat __________ times. Complete this exercise __________ times per day.  STRETCH - Posterior Shoulder Capsule   Stand or sit with good posture. Grasp your right / left elbow and draw it across your chest, keeping it at the same height as your shoulder.  Pull your elbow, so your upper arm comes in closer to your chest. Pull until you feel a gentle stretch in the back of your shoulder.  Hold for __________ seconds. Repeat __________ times. Complete this exercise __________ times per   day. STRENGTHENING EXERCISES - Impingement Syndrome (Rotator Cuff Tendinitis, Bursitis) These exercises may help you when beginning to rehabilitate your injury. They may resolve your symptoms with or without further involvement from your physician, physical therapist or athletic trainer. While completing these exercises, remember:  Muscles can gain both the endurance and the strength needed for everyday activities through controlled exercises.  Complete these exercises as instructed by your physician, physical therapist or athletic trainer. Increase the resistance and repetitions only as guided.  You may experience muscle soreness or fatigue, but the pain or discomfort you are trying to eliminate should never worsen during these exercises. If this pain does get worse, stop and make sure you are following the directions exactly. If the pain is still present after adjustments, discontinue the  exercise until you can discuss the trouble with your clinician.  During your recovery, avoid activity or exercises which involve actions that place your injured hand or elbow above your head or behind your back or head. These positions stress the tissues which you are trying to heal. STRENGTH - Scapular Depression and Adduction   With good posture, sit on a firm chair. Support your arms in front of you, with pillows, arm rests, or on a table top. Have your elbows in line with the sides of your body.  Gently draw your shoulder blades down and toward your mid-back spine. Gradually increase the tension, without tensing the muscles along the top of your shoulders and the back of your neck.  Hold for __________ seconds. Slowly release the tension and relax your muscles completely before starting the next repetition.  After you have practiced this exercise, remove the arm support and complete the exercise in standing as well as sitting position. Repeat __________ times. Complete this exercise __________ times per day.  STRENGTH - Shoulder Abductors, Isometric  With good posture, stand or sit about 4-6 inches from a wall, with your right / left side facing the wall.  Bend your right / left elbow. Gently press your right / left elbow into the wall. Increase the pressure gradually, until you are pressing as hard as you can, without shrugging your shoulder or increasing any shoulder discomfort.  Hold for __________ seconds.  Release the tension slowly. Relax your shoulder muscles completely before you begin the next repetition. Repeat __________ times. Complete this exercise __________ times per day.  STRENGTH - External Rotators, Isometric  Keep your right / left elbow at your side and bend it 90 degrees.  Step into a door frame so that the outside of your right / left wrist can press against the door frame without your upper arm leaving your side.  Gently press your right / left wrist into the  door frame, as if you were trying to swing the back of your hand away from your stomach. Gradually increase the tension, until you are pressing as hard as you can, without shrugging your shoulder or increasing any shoulder discomfort.  Hold for __________ seconds.  Release the tension slowly. Relax your shoulder muscles completely before you begin the next repetition. Repeat __________ times. Complete this exercise __________ times per day.  STRENGTH - Supraspinatus   Stand or sit with good posture. Grasp a __________ weight, or an exercise band or tubing, so that your hand is "thumbs-up," like you are shaking hands.  Slowly lift your right / left arm in a "V" away from your thigh, diagonally into the space between your side and straight ahead. Lift your hand to   shoulder height or as far as you can, without increasing any shoulder pain. At first, many people do not lift their hands above shoulder height.  Avoid shrugging your right / left shoulder as your arm rises, by keeping your shoulder blade tucked down and toward your mid-back spine.  Hold for __________ seconds. Control the descent of your hand, as you slowly return to your starting position. Repeat __________ times. Complete this exercise __________ times per day.  STRENGTH - External Rotators  Secure a rubber exercise band or tubing to a fixed object (table, pole) so that it is at the same height as your right / left elbow when you are standing or sitting on a firm surface.  Stand or sit so that the secured exercise band is at your uninjured side.  Bend your right / left elbow 90 degrees. Place a folded towel or small pillow under your right / left arm, so that your elbow is a few inches away from your side.  Keeping the tension on the exercise band, pull it away from your body, as if pivoting on your elbow. Be sure to keep your body steady, so that the movement is coming only from your rotating shoulder.  Hold for __________  seconds. Release the tension in a controlled manner, as you return to the starting position. Repeat __________ times. Complete this exercise __________ times per day.  STRENGTH - Internal Rotators   Secure a rubber exercise band or tubing to a fixed object (table, pole) so that it is at the same height as your right / left elbow when you are standing or sitting on a firm surface.  Stand or sit so that the secured exercise band is at your right / left side.  Bend your elbow 90 degrees. Place a folded towel or small pillow under your right / left arm so that your elbow is a few inches away from your side.  Keeping the tension on the exercise band, pull it across your body, toward your stomach. Be sure to keep your body steady, so that the movement is coming only from your rotating shoulder.  Hold for __________ seconds. Release the tension in a controlled manner, as you return to the starting position. Repeat __________ times. Complete this exercise __________ times per day.  STRENGTH - Scapular Protractors, Standing   Stand arms length away from a wall. Place your hands on the wall, keeping your elbows straight.  Begin by dropping your shoulder blades down and toward your mid-back spine.  To strengthen your protractors, keep your shoulder blades down, but slide them forward on your rib cage. It will feel as if you are lifting the back of your rib cage away from the wall. This is a subtle motion and can be challenging to complete. Ask your caregiver for further instruction, if you are not sure you are doing the exercise correctly.  Hold for __________ seconds. Slowly return to the starting position, resting the muscles completely before starting the next repetition. Repeat __________ times. Complete this exercise __________ times per day. STRENGTH - Scapular Protractors, Supine  Lie on your back on a firm surface. Extend your right / left arm straight into the air while holding a __________  weight in your hand.  Keeping your head and back in place, lift your shoulder off the floor.  Hold for __________ seconds. Slowly return to the starting position, and allow your muscles to relax completely before starting the next repetition. Repeat __________ times. Complete this   exercise __________ times per day. STRENGTH - Scapular Protractors, Quadruped  Get onto your hands and knees, with your shoulders directly over your hands (or as close as you can be, comfortably).  Keeping your elbows locked, lift the back of your rib cage up into your shoulder blades, so your mid-back rounds out. Keep your neck muscles relaxed.  Hold this position for __________ seconds. Slowly return to the starting position and allow your muscles to relax completely before starting the next repetition. Repeat __________ times. Complete this exercise __________ times per day.  STRENGTH - Scapular Retractors  Secure a rubber exercise band or tubing to a fixed object (table, pole), so that it is at the height of your shoulders when you are either standing, or sitting on a firm armless chair.  With a palm down grip, grasp an end of the band in each hand. Straighten your elbows and lift your hands straight in front of you, at shoulder height. Step back, away from the secured end of the band, until it becomes tense.  Squeezing your shoulder blades together, draw your elbows back toward your sides, as you bend them. Keep your upper arms lifted away from your body throughout the exercise.  Hold for __________ seconds. Slowly ease the tension on the band, as you reverse the directions and return to the starting position. Repeat __________ times. Complete this exercise __________ times per day. STRENGTH - Shoulder Extensors   Secure a rubber exercise band or tubing to a fixed object (table, pole) so that it is at the height of your shoulders when you are either standing, or sitting on a firm armless chair.  With a  thumbs-up grip, grasp an end of the band in each hand. Straighten your elbows and lift your hands straight in front of you, at shoulder height. Step back, away from the secured end of the band, until it becomes tense.  Squeezing your shoulder blades together, pull your hands down to the sides of your thighs. Do not allow your hands to go behind you.  Hold for __________ seconds. Slowly ease the tension on the band, as you reverse the directions and return to the starting position. Repeat __________ times. Complete this exercise __________ times per day.  STRENGTH - Scapular Retractors and External Rotators   Secure a rubber exercise band or tubing to a fixed object (table, pole) so that it is at the height as your shoulders, when you are either standing, or sitting on a firm armless chair.  With a palm down grip, grasp an end of the band in each hand. Bend your elbows 90 degrees and lift your elbows to shoulder height, at your sides. Step back, away from the secured end of the band, until it becomes tense.  Squeezing your shoulder blades together, rotate your shoulders so that your upper arms and elbows remain stationary, but your fists travel upward to head height.  Hold for __________ seconds. Slowly ease the tension on the band, as you reverse the directions and return to the starting position. Repeat __________ times. Complete this exercise __________ times per day.  STRENGTH - Scapular Retractors and External Rotators, Rowing   Secure a rubber exercise band or tubing to a fixed object (table, pole) so that it is at the height of your shoulders, when you are either standing, or sitting on a firm armless chair.  With a palm down grip, grasp an end of the band in each hand. Straighten your elbows and lift your hands   straight in front of you, at shoulder height. Step back, away from the secured end of the band, until it becomes tense.  Step 1: Squeeze your shoulder blades together. Bending  your elbows, draw your hands to your chest, as if you are rowing a boat. At the end of this motion, your hands and elbow should be at shoulder height and your elbows should be out to your sides.  Step 2: Rotate your shoulders, to raise your hands above your head. Your forearms should be vertical and your upper arms should be horizontal.  Hold for __________ seconds. Slowly ease the tension on the band, as you reverse the directions and return to the starting position. Repeat __________ times. Complete this exercise __________ times per day.  STRENGTH - Scapular Depressors  Find a sturdy chair without wheels, such as a dining room chair.  Keeping your feet on the floor, and your hands on the chair arms, lift your bottom up from the seat, and lock your elbows.  Keeping your elbows straight, allow gravity to pull your body weight down. Your shoulders will rise toward your ears.  Raise your body against gravity by drawing your shoulder blades down your back, shortening the distance between your shoulders and ears. Although your feet should always maintain contact with the floor, your feet should progressively support less body weight, as you get stronger.  Hold for __________ seconds. In a controlled and slow manner, lower your body weight to begin the next repetition. Repeat __________ times. Complete this exercise __________ times per day.    This information is not intended to replace advice given to you by your health care provider. Make sure you discuss any questions you have with your health care provider.   Document Released: 08/17/2005 Document Revised: 09/07/2014 Document Reviewed: 11/29/2008 [employer] Interactive Patient Education 2016 [employer]   

## 2016-02-29 NOTE — Progress Notes (Signed)
Subjective:  By signing my name below, I, Stann Oresung-Kai Tsai, attest that this documentation has been prepared under the direction and in the presence of Norberto SorensonEva Shaw, MD. Electronically Signed: Stann Oresung-Kai Tsai, Scribe. 02/29/2016 , 9:10 AM .  Patient was seen in Room 11 .   Patient ID: Daniel Stewart, male    DOB: 12-05-1965, 50 y.o.   MRN: [id number] Chief Complaint  Patient presents with  . Shoulder Pain    left / sharp severe pain   HPI Daniel Stewart is a 50 y.o. male who [facility] complaining of left posterior upper arm. Patient states he's had this pain ongoing for a few years now but recently worsened a few weeks ago when he started exercising with kick boxing. He's applied salonpas pad without relief. He's also taken aleve and advil without relief. He denies prior shoulder problems. He also notes right shoulder having some discomfort as well.   No past medical history on file. Prior to Admission medications   Medication Sig Start Date End Date Taking? Authorizing Provider  chlorhexidine (PERIDEX) 0.12 % solution Use as directed 15 mLs in the mouth or throat 4 (four) times daily. Patient not taking: Reported on 02/29/2016 11/06/15   Sherren MochaEva N Shaw, MD  PARoxetine (PAXIL) 20 MG tablet Take 1 tablet (20 mg total) by mouth daily. Patient not taking: Reported on 02/29/2016 11/20/15   Ofilia NeasMichael L Clark, PA-C   No Known Allergies  Review of Systems  Constitutional: Negative for fever, chills and fatigue.  Musculoskeletal: Positive for myalgias and arthralgias. Negative for back pain, joint swelling, neck pain and neck stiffness.  Skin: Negative for rash and wound.  Neurological: Negative for weakness and numbness.       Objective:   Physical Exam  Constitutional: He is oriented to person, place, and time. He appears well-developed and well-nourished. No distress.  HENT:  Head: Normocephalic and atraumatic.  Eyes: EOM are normal. Pupils are equal, round, and reactive to  light.  Neck: Neck supple.  Cardiovascular: Normal rate.   Pulmonary/Chest: Effort normal. No respiratory distress.  Musculoskeletal: Normal range of motion.  Stable clavicle with stable scapula, no tenderness over the acromion joint or coracoid process, no tenderness over joint itself, no pain over acromion or acromion bursa, 5/5 tricep, bicep and deltoid, positive hawkins and neers, normal strength with resisted external rotation, pain with resisted internal rotation on left, strength 5/5, positive empty can test, 4/5 weakness infraspinatus  Neurological: He is alert and oriented to person, place, and time.  Skin: Skin is warm and dry.  Psychiatric: He has a normal mood and affect. His behavior is normal.  Nursing note and vitals reviewed.   BP 120/70 mmHg  Pulse 84  Temp(Src) 98.7 F (37.1 C)  Resp 16  Ht 5' 6.5" (1.689 m)  Wt 120 lb (54.432 kg)  BMI 19.08 kg/m2  SpO2 99%   Dg Shoulder Left  02/29/2016  CLINICAL DATA:  Left shoulder pain without definitive injury, initial encounter EXAM: LEFT SHOULDER - 2+ VIEW COMPARISON:  None. FINDINGS: There is no evidence of fracture or dislocation. There is no evidence of arthropathy or other focal bone abnormality. Soft tissues are unremarkable. IMPRESSION: No acute abnormality noted. Electronically Signed   By: Alcide CleverMark  Lukens M.D.   On: 02/29/2016 09:39     Risks/benefits of corticosteroid injection reviewed and verbal informed consent obtained.  Posterior left shoulder cleaned with EtoH x 2 followed by betadine x 2.  Anesthesia w/ ethyl chloride  cold spray. Injected with 40mg  of Kenalog and 5cc of 1% lidocaine in the posterior approach to the glenohumeral joint using 23g 1 1/2in needle without complications. Pt tolerated procedure well. No EBL.  Assessment & Plan:   1. Pain in joint of left shoulder   2. Impingement syndrome of shoulder, left   3. Rotator cuff strain, left, initial encounter   Gave home exercises, corticosteroid into joint  today, ice 20min bid to tid, start indomethacin since has failed aleve and advil.  If pain continues in 4 to 6 wks, RTC to consider PT, ortho, and/or MRI.  Orders Placed This Encounter  Procedures  . DG Shoulder Left    Standing Status: Future     Number of Occurrences: 1     Standing Expiration Date: 02/28/2017    Order Specific Question:  Reason for Exam (SYMPTOM  OR DIAGNOSIS REQUIRED)    Answer:  pain for years worsening - signs of impingment, rotator cuff tear, and arthritis on exam    Order Specific Question:  Preferred imaging location?    Answer:  External    Meds ordered this encounter  Medications  . triamcinolone acetonide (KENALOG-40) injection 40 mg    Sig:   . indomethacin (INDOCIN) 50 MG capsule    Sig: Take 1 capsule (50 mg total) by mouth 2 (two) times daily with a meal.    Dispense:  60 capsule    Refill:  1    I personally performed the services described in this documentation, which was scribed in my presence. The recorded information has been reviewed and considered, and addended by me as needed.   Norberto SorensonEva Shaw, M.D.  [facility]  [place] [address] [place], [place] [address] [phone] phone [phone] fax  02/29/2016 9:47 AM

## 2016-04-25 ENCOUNTER — Ambulatory Visit (INDEPENDENT_AMBULATORY_CARE_PROVIDER_SITE_OTHER): Payer: 59 | Admitting: Physician Assistant

## 2016-04-25 ENCOUNTER — Ambulatory Visit (INDEPENDENT_AMBULATORY_CARE_PROVIDER_SITE_OTHER): Payer: 59

## 2016-04-25 ENCOUNTER — Encounter: Payer: Self-pay | Admitting: Physician Assistant

## 2016-04-25 VITALS — BP 132/80 | HR 85 | Temp 98.1°F | Resp 16 | Ht 68.0 in | Wt 119.6 lb

## 2016-04-25 DIAGNOSIS — R059 Cough, unspecified: Secondary | ICD-10-CM

## 2016-04-25 DIAGNOSIS — R05 Cough: Secondary | ICD-10-CM

## 2016-04-25 LAB — POCT CBC
Granulocyte percent: 63.3 %G (ref 37–80)
HEMATOCRIT: 37.4 % — AB (ref 43.5–53.7)
HEMOGLOBIN: 12.8 g/dL — AB (ref 14.1–18.1)
LYMPH, POC: 2.4 (ref 0.6–3.4)
MCH, POC: 28 pg (ref 27–31.2)
MCHC: 34.4 g/dL (ref 31.8–35.4)
MCV: 81.6 fL (ref 80–97)
MID (cbc): 0.6 (ref 0–0.9)
MPV: 8.5 fL (ref 0–99.8)
POC GRANULOCYTE: 5.2 (ref 2–6.9)
POC LYMPH %: 29 % (ref 10–50)
POC MID %: 7.7 % (ref 0–12)
Platelet Count, POC: 274 10*3/uL (ref 142–424)
RBC: 4.58 M/uL — AB (ref 4.69–6.13)
RDW, POC: 12.3 %
WBC: 8.2 10*3/uL (ref 4.6–10.2)

## 2016-04-25 MED ORDER — DOXYCYCLINE HYCLATE 100 MG PO CAPS: 100.0000 mg | ORAL_CAPSULE | Freq: Two times a day (BID) | ORAL | 0 refills | Status: AC

## 2016-04-25 MED ORDER — RANITIDINE HCL 150 MG PO TABS: 150.0000 mg | ORAL_TABLET | Freq: Every day | ORAL | 0 refills | Status: DC

## 2016-04-25 NOTE — Progress Notes (Signed)
04/25/2016 10:36 AM   DOB: 06/23/1966 / MRN: [id number]  SUBJECTIVE:  Daniel Stewart is a 50 y.o. male presenting for cough that started two weeks.  Complains of mild mucous production and wheezing as well.  Feels that he is not worse or better.  Denies allergic URI symptoms, previous viral uri symptoms, and any GERD symptoms in the last 2-3 weeks though he does have a history of this.  Does have a 25 pack year history of smoking and quit about 10 years ago.     He has No Known Allergies.   He  has no past medical history on file.    He  reports that he has quit smoking. He has never used smokeless tobacco. He  has no sexual activity history on file. The patient  has no past surgical history on file.  His family history includes Cancer in his father; Diabetes in his mother.  Review of Systems  Constitutional: Negative for chills and fever.  Respiratory: Positive for cough, sputum production and wheezing. Negative for hemoptysis and shortness of breath.   Cardiovascular: Negative for chest pain and leg swelling.  Gastrointestinal: Negative for nausea.  Skin: Negative for itching and rash.  Neurological: Negative for dizziness and headaches.  Psychiatric/Behavioral: Negative for depression.    The problem list and medications were reviewed and updated by myself where necessary and exist elsewhere in the encounter.   OBJECTIVE:  BP 132/80 (BP Location: Right Arm, Patient Position: Sitting, Cuff Size: Normal)   Pulse 85   Temp 98.1 F (36.7 C) (Oral)   Resp 16   Ht 5\' 8"  (1.727 m)   Wt 119 lb 9.6 oz (54.3 kg)   SpO2 98%   BMI 18.19 kg/m   Physical Exam  Constitutional: He is oriented to person, place, and time. He appears well-developed and well-nourished. No distress.  Cardiovascular: Normal rate, regular rhythm, normal heart sounds and intact distal pulses.   Pulmonary/Chest: Effort normal. He has rales (right lung base).  Musculoskeletal: Normal range of motion.    Neurological: He is alert and oriented to person, place, and time. He has normal reflexes. He displays normal reflexes. No cranial nerve deficit. Coordination normal.  Skin: Skin is warm and dry. He is not diaphoretic.  Psychiatric: He has a normal mood and affect.    Results for orders placed or performed in visit on 04/25/16 (from the past 72 hour(s))  POCT CBC     Status: Abnormal   Collection Time: 04/25/16 10:16 AM  Result Value Ref Range   WBC 8.2 4.6 - 10.2 K/uL   Lymph, poc 2.4 0.6 - 3.4   POC LYMPH PERCENT 29.0 10 - 50 %L   MID (cbc) 0.6 0 - 0.9   POC MID % 7.7 0 - 12 %M   POC Granulocyte 5.2 2 - 6.9   Granulocyte percent 63.3 37 - 80 %G   RBC 4.58 (A) 4.69 - 6.13 M/uL   Hemoglobin 12.8 (A) 14.1 - 18.1 g/dL   HCT, POC 40.937.4 (A) 81.143.5 - 53.7 %   MCV 81.6 80 - 97 fL   MCH, POC 28.0 27 - 31.2 pg   MCHC 34.4 31.8 - 35.4 g/dL   RDW, POC 91.412.3 %   Platelet Count, POC 274 142 - 424 K/uL   MPV 8.5 0 - 99.8 fL    Dg Chest 2 View  Result Date: 04/25/2016 CLINICAL DATA:  Cough about 2 weeks  shielded EXAM: CHEST  2 VIEW  COMPARISON:  None. FINDINGS: Normal mediastinum and cardiac silhouette. Normal pulmonary vasculature. No evidence of effusion, infiltrate, or pneumothorax. No acute bony abnormality. IMPRESSION: No acute cardiopulmonary process. Electronically Signed   By: Genevive Bi M.D.   On: 04/25/2016 10:23    ASSESSMENT AND PLAN  Daniel Stewart was seen today for cough.  Diagnoses and all orders for this visit:  Cough: Exam concerning for pneumonia however rads and CBC are normal. His peak flow is normal x 3 at 550.   Advised we treat for CAP and GERD given his history of GERD.  He will come back if he is not better in ten days.  -     DG Chest 2 View; Future -     POCT CBC -     doxycycline (VIBRAMYCIN) 100 MG capsule; Take 1 capsule (100 mg total) by mouth 2 (two) times daily. -     ranitidine (ZANTAC) 150 MG tablet; Take 1 tablet (150 mg total) by mouth at  bedtime.    The patient is advised to call or return to clinic if he does not see an improvement in symptoms, or to seek the care of the closest emergency department if he worsens with the above plan.   Deliah Boston, MHS, PA-C [facility] 04/25/2016 10:36 AM

## 2016-04-25 NOTE — Patient Instructions (Signed)
     IF you received an x-ray today, you will receive an invoice from [facility]. Please contact [facility] at [phone] with questions or concerns regarding your invoice.   IF you received labwork today, you will receive an invoice from [facility]. Please contact [facility] at [phone] with questions or concerns regarding your invoice.   Our billing staff will not be able to assist you with questions regarding bills from these companies.  You will be contacted with the lab results as soon as they are available. The fastest way to get your results is to activate your My Chart account. Instructions are located on the last page of this paperwork. If you have not heard from us regarding the results in 2 weeks, please contact this office.      

## 2017-05-19 ENCOUNTER — Emergency Department (HOSPITAL_COMMUNITY): Payer: Worker's Compensation

## 2017-05-19 ENCOUNTER — Inpatient Hospital Stay (HOSPITAL_COMMUNITY)
Admission: EM | Admit: 2017-05-19 | Discharge: 2017-05-25 | DRG: 501 | Disposition: A | Discharge status: Home / Self Care | Payer: Worker's Compensation | Attending: Student | Admitting: Student

## 2017-05-19 ENCOUNTER — Encounter (HOSPITAL_COMMUNITY): Payer: Self-pay | Admitting: Emergency Medicine

## 2017-05-19 ENCOUNTER — Emergency Department (HOSPITAL_COMMUNITY): Payer: Worker's Compensation | Admitting: Anesthesiology

## 2017-05-19 ENCOUNTER — Encounter (HOSPITAL_COMMUNITY)
Admission: EM | Disposition: A | Discharge status: Home / Self Care | Payer: Self-pay | Source: Home / Self Care | Attending: Student

## 2017-05-19 DIAGNOSIS — D62 Acute posthemorrhagic anemia: Secondary | ICD-10-CM | POA: Diagnosis not present

## 2017-05-19 DIAGNOSIS — S4991XA Unspecified injury of right shoulder and upper arm, initial encounter: Secondary | ICD-10-CM

## 2017-05-19 DIAGNOSIS — Q899 Congenital malformation, unspecified: Secondary | ICD-10-CM

## 2017-05-19 DIAGNOSIS — S53104A Unspecified dislocation of right ulnohumeral joint, initial encounter: Secondary | ICD-10-CM | POA: Diagnosis present

## 2017-05-19 DIAGNOSIS — Z833 Family history of diabetes mellitus: Secondary | ICD-10-CM | POA: Diagnosis not present

## 2017-05-19 DIAGNOSIS — S53124A Posterior dislocation of right ulnohumeral joint, initial encounter: Secondary | ICD-10-CM | POA: Diagnosis present

## 2017-05-19 DIAGNOSIS — R066 Hiccough: Secondary | ICD-10-CM | POA: Diagnosis not present

## 2017-05-19 DIAGNOSIS — Z809 Family history of malignant neoplasm, unspecified: Secondary | ICD-10-CM | POA: Diagnosis not present

## 2017-05-19 DIAGNOSIS — S51011A Laceration without foreign body of right elbow, initial encounter: Secondary | ICD-10-CM | POA: Diagnosis present

## 2017-05-19 DIAGNOSIS — Z87891 Personal history of nicotine dependence: Secondary | ICD-10-CM | POA: Diagnosis not present

## 2017-05-19 DIAGNOSIS — W230XXA Caught, crushed, jammed, or pinched between moving objects, initial encounter: Secondary | ICD-10-CM | POA: Diagnosis present

## 2017-05-19 DIAGNOSIS — T148XXA Other injury of unspecified body region, initial encounter: Secondary | ICD-10-CM

## 2017-05-19 DIAGNOSIS — S45101A Unspecified injury of brachial artery, right side, initial encounter: Secondary | ICD-10-CM | POA: Diagnosis present

## 2017-05-19 DIAGNOSIS — S51811A Laceration without foreign body of right forearm, initial encounter: Secondary | ICD-10-CM | POA: Diagnosis present

## 2017-05-19 DIAGNOSIS — Z419 Encounter for procedure for purposes other than remedying health state, unspecified: Secondary | ICD-10-CM

## 2017-05-19 DIAGNOSIS — T79A11A Traumatic compartment syndrome of right upper extremity, initial encounter: Secondary | ICD-10-CM | POA: Insufficient documentation

## 2017-05-19 DIAGNOSIS — S471XXA Crushing injury of right shoulder and upper arm, initial encounter: Secondary | ICD-10-CM

## 2017-05-19 DIAGNOSIS — S45191A Other specified injury of brachial artery, right side, initial encounter: Secondary | ICD-10-CM

## 2017-05-19 DIAGNOSIS — S5701XA Crushing injury of right elbow, initial encounter: Secondary | ICD-10-CM | POA: Diagnosis present

## 2017-05-19 HISTORY — PX: I&D EXTREMITY: SHX5045

## 2017-05-19 HISTORY — PX: EXTERNAL FIXATION ARM: SHX1552

## 2017-05-19 HISTORY — PX: ARTERY REPAIR: SHX559

## 2017-05-19 HISTORY — DX: Unspecified dislocation of right ulnohumeral joint, initial encounter: S53.104A

## 2017-05-19 HISTORY — DX: Unspecified injury of brachial artery, right side, initial encounter: S45.101A

## 2017-05-19 LAB — POCT I-STAT, CHEM 8
BUN: 25 mg/dL — ABNORMAL HIGH (ref 6–20)
CALCIUM ION: 1.16 mmol/L (ref 1.15–1.40)
Chloride: 107 mmol/L (ref 101–111)
Creatinine, Ser: 0.9 mg/dL (ref 0.61–1.24)
GLUCOSE: 130 mg/dL — AB (ref 65–99)
HCT: 35 % — ABNORMAL LOW (ref 39.0–52.0)
HEMOGLOBIN: 11.9 g/dL — AB (ref 13.0–17.0)
Potassium: 3.9 mmol/L (ref 3.5–5.1)
SODIUM: 141 mmol/L (ref 135–145)
TCO2: 23 mmol/L (ref 22–32)

## 2017-05-19 LAB — CREATININE, SERUM: Creatinine, Ser: 1.02 mg/dL (ref 0.61–1.24)

## 2017-05-19 LAB — CBC
HCT: 33 % — ABNORMAL LOW (ref 39.0–52.0)
Hemoglobin: 10.7 g/dL — ABNORMAL LOW (ref 13.0–17.0)
MCH: 26.9 pg (ref 26.0–34.0)
MCHC: 32.4 g/dL (ref 30.0–36.0)
MCV: 82.9 fL (ref 78.0–100.0)
PLATELETS: 239 10*3/uL (ref 150–400)
RBC: 3.98 MIL/uL — AB (ref 4.22–5.81)
RDW: 13 % (ref 11.5–15.5)
WBC: 15.2 10*3/uL — AB (ref 4.0–10.5)

## 2017-05-19 SURGERY — EXTERNAL FIXATION, UPPER EXTREMITY
Anesthesia: General | Site: Arm Upper | Laterality: Right

## 2017-05-19 MED ORDER — HYDROMORPHONE HCL 1 MG/ML IJ SOLN
2.0000 mg | Freq: Once | INTRAMUSCULAR | Status: AC
  Administered 2017-05-19: 2 mg via INTRAVENOUS

## 2017-05-19 MED ORDER — DEXAMETHASONE SODIUM PHOSPHATE 10 MG/ML IJ SOLN
INTRAMUSCULAR | Status: DC | PRN
  Administered 2017-05-19: 10 mg via INTRAVENOUS

## 2017-05-19 MED ORDER — SODIUM CHLORIDE 0.9 % IR SOLN
Status: DC | PRN
  Administered 2017-05-19: 3000 mL

## 2017-05-19 MED ORDER — DEXTROSE 5 % IV SOLN: 500.0000 mg | Freq: Four times a day (QID) | INTRAVENOUS | Status: DC | PRN

## 2017-05-19 MED ORDER — TETANUS-DIPHTH-ACELL PERTUSSIS 5-2.5-18.5 LF-MCG/0.5 IM SUSP: 0.5000 mL | Freq: Once | INTRAMUSCULAR | Status: DC

## 2017-05-19 MED ORDER — VANCOMYCIN HCL 1000 MG IV SOLR
INTRAVENOUS | Status: AC
  Filled 2017-05-19: qty 1000

## 2017-05-19 MED ORDER — MIDAZOLAM HCL 2 MG/2ML IJ SOLN
2.0000 mg | Freq: Once | INTRAMUSCULAR | Status: AC
  Administered 2017-05-19: 2 mg via INTRAVENOUS

## 2017-05-19 MED ORDER — MIDAZOLAM HCL 5 MG/5ML IJ SOLN
INTRAMUSCULAR | Status: DC | PRN
  Administered 2017-05-19: 2 mg via INTRAVENOUS

## 2017-05-19 MED ORDER — ONDANSETRON HCL 4 MG PO TABS: 4.0000 mg | ORAL_TABLET | Freq: Four times a day (QID) | ORAL | Status: DC | PRN

## 2017-05-19 MED ORDER — PHENYLEPHRINE HCL 10 MG/ML IJ SOLN
INTRAMUSCULAR | Status: DC | PRN
  Administered 2017-05-19: 80 ug via INTRAVENOUS
  Administered 2017-05-19: 120 ug via INTRAVENOUS
  Administered 2017-05-19: 40 ug via INTRAVENOUS
  Administered 2017-05-19: 80 ug via INTRAVENOUS

## 2017-05-19 MED ORDER — PROMETHAZINE HCL 25 MG/ML IJ SOLN: 6.2500 mg | INTRAMUSCULAR | Status: DC | PRN

## 2017-05-19 MED ORDER — PHENYLEPHRINE HCL 10 MG/ML IJ SOLN
INTRAVENOUS | Status: DC | PRN
  Administered 2017-05-19: 25 ug/min via INTRAVENOUS

## 2017-05-19 MED ORDER — FENTANYL CITRATE (PF) 250 MCG/5ML IJ SOLN
INTRAMUSCULAR | Status: AC
  Filled 2017-05-19: qty 5

## 2017-05-19 MED ORDER — ONDANSETRON HCL 4 MG/2ML IJ SOLN: 4.0000 mg | Freq: Four times a day (QID) | INTRAMUSCULAR | Status: DC | PRN

## 2017-05-19 MED ORDER — FENTANYL CITRATE (PF) 100 MCG/2ML IJ SOLN
INTRAMUSCULAR | Status: DC | PRN
  Administered 2017-05-19: 100 ug via INTRAVENOUS
  Administered 2017-05-19: 50 ug via INTRAVENOUS
  Administered 2017-05-19: 100 ug via INTRAVENOUS
  Administered 2017-05-19 (×2): 50 ug via INTRAVENOUS

## 2017-05-19 MED ORDER — MORPHINE SULFATE (PF) 2 MG/ML IV SOLN: 2.0000 mg | INTRAVENOUS | Status: DC | PRN

## 2017-05-19 MED ORDER — SODIUM CHLORIDE 0.9 % IV SOLN
INTRAVENOUS | Status: DC | PRN
  Administered 2017-05-19: 250 mL

## 2017-05-19 MED ORDER — IOPAMIDOL (ISOVUE-370) INJECTION 76%
INTRAVENOUS | Status: AC
  Administered 2017-05-19: 100 mL via INTRAVENOUS
  Filled 2017-05-19: qty 100

## 2017-05-19 MED ORDER — METHOCARBAMOL 500 MG PO TABS
500.0000 mg | ORAL_TABLET | Freq: Four times a day (QID) | ORAL | Status: DC | PRN
  Administered 2017-05-20 – 2017-05-24 (×5): 500 mg via ORAL
  Filled 2017-05-19 (×5): qty 1

## 2017-05-19 MED ORDER — HYDROMORPHONE HCL 1 MG/ML IJ SOLN
INTRAMUSCULAR | Status: AC
  Administered 2017-05-19: 0.5 mg via INTRAVENOUS
  Filled 2017-05-19: qty 1

## 2017-05-19 MED ORDER — OXYCODONE HCL 5 MG PO TABS: 5.0000 mg | ORAL_TABLET | Freq: Once | ORAL | Status: DC | PRN

## 2017-05-19 MED ORDER — HYDROMORPHONE HCL 1 MG/ML IJ SOLN
0.2500 mg | INTRAMUSCULAR | Status: DC | PRN
  Administered 2017-05-19 (×2): 0.5 mg via INTRAVENOUS

## 2017-05-19 MED ORDER — MORPHINE SULFATE (PF) 4 MG/ML IV SOLN
2.0000 mg | INTRAVENOUS | Status: DC | PRN
  Administered 2017-05-19 – 2017-05-21 (×12): 2 mg via INTRAVENOUS
  Filled 2017-05-19 (×12): qty 1

## 2017-05-19 MED ORDER — MEPERIDINE HCL 25 MG/ML IJ SOLN: 6.2500 mg | INTRAMUSCULAR | Status: DC | PRN

## 2017-05-19 MED ORDER — KCL IN DEXTROSE-NACL 20-5-0.45 MEQ/L-%-% IV SOLN
INTRAVENOUS | Status: DC
  Administered 2017-05-19: 23:00:00 via INTRAVENOUS
  Filled 2017-05-19 (×3): qty 1000

## 2017-05-19 MED ORDER — MIDAZOLAM HCL 2 MG/2ML IJ SOLN
INTRAMUSCULAR | Status: AC
  Filled 2017-05-19: qty 2

## 2017-05-19 MED ORDER — ALBUMIN HUMAN 5 % IV SOLN
INTRAVENOUS | Status: DC | PRN
  Administered 2017-05-19: 14:00:00 via INTRAVENOUS

## 2017-05-19 MED ORDER — LACTATED RINGERS IV SOLN
INTRAVENOUS | Status: DC
  Administered 2017-05-19 (×3): via INTRAVENOUS

## 2017-05-19 MED ORDER — ONDANSETRON HCL 4 MG/2ML IJ SOLN
INTRAMUSCULAR | Status: DC | PRN
  Administered 2017-05-19: 4 mg via INTRAVENOUS

## 2017-05-19 MED ORDER — BACITRACIN ZINC 500 UNIT/GM EX OINT
TOPICAL_OINTMENT | CUTANEOUS | Status: AC
  Filled 2017-05-19: qty 28.35

## 2017-05-19 MED ORDER — FENTANYL CITRATE (PF) 100 MCG/2ML IJ SOLN
50.0000 ug | Freq: Once | INTRAMUSCULAR | Status: AC
  Administered 2017-05-19: 50 ug via INTRAVENOUS
  Filled 2017-05-19: qty 2

## 2017-05-19 MED ORDER — CEFAZOLIN SODIUM-DEXTROSE 2-4 GM/100ML-% IV SOLN
2.0000 g | Freq: Once | INTRAVENOUS | Status: AC
Start: 2017-05-19 — End: 2017-05-19
  Administered 2017-05-19: 2 g via INTRAVENOUS
  Filled 2017-05-19: qty 100

## 2017-05-19 MED ORDER — OXYCODONE HCL 5 MG/5ML PO SOLN: 5.0000 mg | Freq: Once | ORAL | Status: DC | PRN

## 2017-05-19 MED ORDER — LIDOCAINE HCL (CARDIAC) 20 MG/ML IV SOLN
INTRAVENOUS | Status: DC | PRN
  Administered 2017-05-19: 60 mg via INTRAVENOUS

## 2017-05-19 MED ORDER — DEXMEDETOMIDINE HCL IN NACL 200 MCG/50ML IV SOLN
INTRAVENOUS | Status: AC
  Filled 2017-05-19: qty 50

## 2017-05-19 MED ORDER — MIDAZOLAM HCL 2 MG/2ML IJ SOLN
2.0000 mg | Freq: Once | INTRAMUSCULAR | Status: DC
  Filled 2017-05-19: qty 2

## 2017-05-19 MED ORDER — SUGAMMADEX SODIUM 200 MG/2ML IV SOLN
INTRAVENOUS | Status: DC | PRN
  Administered 2017-05-19: 120 mg via INTRAVENOUS

## 2017-05-19 MED ORDER — HYDROMORPHONE HCL 1 MG/ML IJ SOLN
2.0000 mg | Freq: Once | INTRAMUSCULAR | Status: DC
  Filled 2017-05-19: qty 2

## 2017-05-19 MED ORDER — 0.9 % SODIUM CHLORIDE (POUR BTL) OPTIME
TOPICAL | Status: DC | PRN
  Administered 2017-05-19: 1000 mL

## 2017-05-19 MED ORDER — PROPOFOL 10 MG/ML IV BOLUS
INTRAVENOUS | Status: DC | PRN
  Administered 2017-05-19: 100 mg via INTRAVENOUS
  Administered 2017-05-19: 20 mg via INTRAVENOUS

## 2017-05-19 MED ORDER — ONDANSETRON HCL 4 MG/2ML IJ SOLN
4.0000 mg | Freq: Once | INTRAMUSCULAR | Status: AC
  Administered 2017-05-19: 4 mg via INTRAVENOUS
  Filled 2017-05-19: qty 2

## 2017-05-19 MED ORDER — ROCURONIUM BROMIDE 100 MG/10ML IV SOLN
INTRAVENOUS | Status: DC | PRN
  Administered 2017-05-19 (×3): 20 mg via INTRAVENOUS
  Administered 2017-05-19: 40 mg via INTRAVENOUS

## 2017-05-19 SURGICAL SUPPLY — 79 items
ANCHOR SUT 1.45 SZ 1 SHORT (Anchor) ×8 IMPLANT
BANDAGE ACE 3X5.8 VEL STRL LF (GAUZE/BANDAGES/DRESSINGS) ×4 IMPLANT
BANDAGE ACE 4X5 VEL STRL LF (GAUZE/BANDAGES/DRESSINGS) ×4 IMPLANT
BANDAGE ACE 6X5 VEL STRL LF (GAUZE/BANDAGES/DRESSINGS) ×4 IMPLANT
BANDAGE ESMARK 6X9 LF (GAUZE/BANDAGES/DRESSINGS) ×2 IMPLANT
BAR GLASS FIBER EXFX 11X250 (EXFIX) ×4 IMPLANT
BIT DRILL 3.2 XTRAFIX BLUE (BIT) ×4 IMPLANT
BIT DRILL CANN MED FLUTE 4.0 (BIT) ×2 IMPLANT
BNDG COHESIVE 4X5 TAN STRL (GAUZE/BANDAGES/DRESSINGS) ×4 IMPLANT
BNDG COHESIVE 6X5 TAN STRL LF (GAUZE/BANDAGES/DRESSINGS) IMPLANT
BNDG ESMARK 6X9 LF (GAUZE/BANDAGES/DRESSINGS) ×4
BNDG GAUZE ELAST 4 BULKY (GAUZE/BANDAGES/DRESSINGS) ×4 IMPLANT
BNDG GAUZE STRTCH 6 (GAUZE/BANDAGES/DRESSINGS) ×12 IMPLANT
BRUSH SCRUB SURG 4.25 DISP (MISCELLANEOUS) IMPLANT
CATH EMB 3FR 40CM (CATHETERS) ×4 IMPLANT
CHLORAPREP W/TINT 26ML (MISCELLANEOUS) IMPLANT
CLAMP PIN 45MM 1 BAR (EXFIX) ×8 IMPLANT
CLIP TI MEDIUM 6 (CLIP) ×8 IMPLANT
CLIP TI WIDE RED SMALL 6 (CLIP) ×4 IMPLANT
CLOSURE WOUND 1/2 X4 (GAUZE/BANDAGES/DRESSINGS)
COVER SURGICAL LIGHT HANDLE (MISCELLANEOUS) ×4 IMPLANT
CUFF TOURNIQUET SINGLE 18IN (TOURNIQUET CUFF) IMPLANT
DERMABOND ADHESIVE PROPEN (GAUZE/BANDAGES/DRESSINGS) ×2
DERMABOND ADVANCED .7 DNX6 (GAUZE/BANDAGES/DRESSINGS) ×2 IMPLANT
DRAPE C-ARM 42X72 X-RAY (DRAPES) IMPLANT
DRAPE C-ARMOR (DRAPES) IMPLANT
DRAPE U-SHAPE 47X51 STRL (DRAPES) ×4 IMPLANT
DRILL CANN 4.0MM (BIT) ×4
DRSG ADAPTIC 3X8 NADH LF (GAUZE/BANDAGES/DRESSINGS) ×4 IMPLANT
DRSG MEPITEL 4X7.2 (GAUZE/BANDAGES/DRESSINGS) IMPLANT
DRSG VAC ATS SM SENSATRAC (GAUZE/BANDAGES/DRESSINGS) ×4 IMPLANT
ELECT REM PT RETURN 9FT ADLT (ELECTROSURGICAL) ×4
ELECTRODE REM PT RTRN 9FT ADLT (ELECTROSURGICAL) ×2 IMPLANT
EVACUATOR 1/8 PVC DRAIN (DRAIN) IMPLANT
GAUZE SPONGE 4X4 12PLY STRL (GAUZE/BANDAGES/DRESSINGS) ×4 IMPLANT
GLOVE BIO SURGEON STRL SZ7.5 (GLOVE) ×16 IMPLANT
GLOVE BIO SURGEON STRL SZ8 (GLOVE) ×4 IMPLANT
GLOVE BIOGEL PI IND STRL 6.5 (GLOVE) ×6 IMPLANT
GLOVE BIOGEL PI IND STRL 7.5 (GLOVE) ×2 IMPLANT
GLOVE BIOGEL PI IND STRL 8 (GLOVE) ×2 IMPLANT
GLOVE BIOGEL PI INDICATOR 6.5 (GLOVE) ×6
GLOVE BIOGEL PI INDICATOR 7.5 (GLOVE) ×2
GLOVE BIOGEL PI INDICATOR 8 (GLOVE) ×2
GLOVE SURG SS PI 6.5 STRL IVOR (GLOVE) ×12 IMPLANT
GOWN STRL REUS W/ TWL LRG LVL3 (GOWN DISPOSABLE) ×4 IMPLANT
GOWN STRL REUS W/ TWL XL LVL3 (GOWN DISPOSABLE) ×2 IMPLANT
GOWN STRL REUS W/TWL LRG LVL3 (GOWN DISPOSABLE) ×4
GOWN STRL REUS W/TWL XL LVL3 (GOWN DISPOSABLE) ×2
HALF PIN 4MM (EXFIX) ×8 IMPLANT
HANDPIECE INTERPULSE COAX TIP (DISPOSABLE) ×2
KIT BASIN OR (CUSTOM PROCEDURE TRAY) ×4 IMPLANT
KIT ROOM TURNOVER OR (KITS) ×4 IMPLANT
MANIFOLD NEPTUNE II (INSTRUMENTS) ×4 IMPLANT
NEEDLE 22X1 1/2 (OR ONLY) (NEEDLE) IMPLANT
NS IRRIG 1000ML POUR BTL (IV SOLUTION) ×8 IMPLANT
PACK ORTHO EXTREMITY (CUSTOM PROCEDURE TRAY) ×4 IMPLANT
PAD ARMBOARD 7.5X6 YLW CONV (MISCELLANEOUS) ×8 IMPLANT
PADDING CAST COTTON 6X4 STRL (CAST SUPPLIES) ×8 IMPLANT
PIN 4X100X20MM EXFIX LG BLUNT (PIN) ×8 IMPLANT
PIN HALF 5X160X35 BLUNT TIP (EXFIX) ×8 IMPLANT
SET HNDPC FAN SPRY TIP SCT (DISPOSABLE) ×2 IMPLANT
SPONGE LAP 18X18 X RAY DECT (DISPOSABLE) ×8 IMPLANT
STAPLER VISISTAT 35W (STAPLE) IMPLANT
STOCKINETTE IMPERVIOUS 9X36 MD (GAUZE/BANDAGES/DRESSINGS) ×4 IMPLANT
STOCKINETTE IMPERVIOUS LG (DRAPES) IMPLANT
STRIP CLOSURE SKIN 1/2X4 (GAUZE/BANDAGES/DRESSINGS) IMPLANT
SUT MNCRL AB 3-0 PS2 18 (SUTURE) ×4 IMPLANT
SUT MON AB 2-0 CT1 36 (SUTURE) ×4 IMPLANT
SUT PDS AB 2-0 CT1 27 (SUTURE) IMPLANT
SUT PROLENE 0 CT (SUTURE) ×4 IMPLANT
SUT PROLENE 6 0 BV (SUTURE) ×8 IMPLANT
SWAB CULTURE ESWAB REG 1ML (MISCELLANEOUS) IMPLANT
TOWEL OR 17X24 6PK STRL BLUE (TOWEL DISPOSABLE) ×8 IMPLANT
TOWEL OR 17X26 10 PK STRL BLUE (TOWEL DISPOSABLE) ×8 IMPLANT
TUBE CONNECTING 12'X1/4 (SUCTIONS) ×1
TUBE CONNECTING 12X1/4 (SUCTIONS) ×3 IMPLANT
UNDERPAD 30X30 (UNDERPADS AND DIAPERS) ×4 IMPLANT
WATER STERILE IRR 1000ML POUR (IV SOLUTION) IMPLANT
YANKAUER SUCT BULB TIP NO VENT (SUCTIONS) ×4 IMPLANT

## 2017-05-19 NOTE — ED Provider Notes (Signed)
[facility]-EMERGENCY DEPT Provider Note   CSN: [id number] Arrival date & time: 05/19/17  4098     History   Chief Complaint Chief Complaint  Patient presents with  . Arm Injury    HPI Daniel Stewart is a 51 y.o. male.  Level V caveat for acuity of condition. Patient brought in by EMS with crush injury to his right arm. This apparently got stuck in an auger type machine. He has deformity and laceration to right elbow and proximal forearm. Arm splinted by EMS. Did not hit head or lose consciousness. Denies any blood thinner use. EMS reports worsening pain, pallor and weakness in the right arm.   The history is provided by the patient and the EMS personnel. The history is limited by the condition of the patient.  Arm Injury      No past medical history on file.  There are no active problems to display for this patient.   No past surgical history on file.     Home Medications    Prior to Admission medications   Medication Sig Start Date End Date Taking? Authorizing Provider  chlorhexidine (PERIDEX) 0.12 % solution Use as directed 15 mLs in the mouth or throat 4 (four) times daily. Patient not taking: Reported on 02/29/2016 11/06/15   Sherren Mocha, MD  indomethacin (INDOCIN) 50 MG capsule Take 1 capsule (50 mg total) by mouth 2 (two) times daily with a meal. Patient not taking: Reported on 04/25/2016 02/29/16   Sherren Mocha, MD  PARoxetine (PAXIL) 20 MG tablet Take 1 tablet (20 mg total) by mouth daily. Patient not taking: Reported on 02/29/2016 11/20/15   Ofilia Neas, PA-C  ranitidine (ZANTAC) 150 MG tablet Take 1 tablet (150 mg total) by mouth at bedtime. 04/25/16   Ofilia Neas, PA-C    Family History Family History  Problem Relation Age of Onset  . Diabetes Mother   . Cancer Father     Social History Social History  Substance Use Topics  . Smoking status: Former Games developer  . Smokeless tobacco: Never Used  . Alcohol use Not on file     Allergies   Patient  has no known allergies.   Review of Systems Review of Systems  Constitutional: Negative for activity change, appetite change and fever.  HENT: Negative for congestion.   Respiratory: Negative for cough, chest tightness and shortness of breath.   Cardiovascular: Negative for chest pain.  Gastrointestinal: Negative for abdominal pain, nausea and vomiting.  Genitourinary: Negative for dysuria, hematuria and testicular pain.  Musculoskeletal: Positive for arthralgias and myalgias.  Skin: Positive for wound.  Neurological: Negative for dizziness, weakness, light-headedness and headaches.   all other systems are negative except as noted in the HPI and PMH.     Physical Exam Updated Vital Signs BP (!) 159/94   Pulse (!) 102   Resp 17   SpO2 97%   Physical Exam  Constitutional: He is oriented to person, place, and time. He appears well-developed and well-nourished. No distress.  HENT:  Head: Normocephalic and atraumatic.  Mouth/Throat: Oropharynx is clear and moist. No oropharyngeal exudate.  Eyes: Pupils are equal, round, and reactive to light. Conjunctivae and EOM are normal.  Neck: Normal range of motion. Neck supple.  No meningismus.  Cardiovascular: Normal rate, regular rhythm, normal heart sounds and intact distal pulses.   No murmur heard. Pulmonary/Chest: Effort normal and breath sounds normal. No respiratory distress. He exhibits tenderness.  Large abrasion across chest and upper  abdomen  Abdominal: Soft. There is no tenderness. There is no rebound and no guarding.  Musculoskeletal: He exhibits edema and tenderness.  Swelling and deformity to right elbow with tenseness compartments of proximal forearm. There is approximately 5 cm laceration across posterior forearm with bleeding controlled.  Unable to palpate radial or ulnar pulse. Pulse is not present with Doppler either.  Cardinal hand movements intact though hand appears dusky.  Neurological: He is alert and oriented  to person, place, and time. No cranial nerve deficit. He exhibits normal muscle tone. Coordination normal.   5/5 strength throughout. CN 2-12 intact.Equal grip strength.   Skin: Skin is warm.  Psychiatric: He has a normal mood and affect. His behavior is normal.  Nursing note and vitals reviewed.    ED Treatments / Results  Labs (all labs ordered are listed, but only abnormal results are displayed) Labs Reviewed  I-STAT CHEM 8, ED    EKG  EKG Interpretation None       Radiology No results found.  Procedures Reduction of dislocation Date/Time: 05/19/2017 7:49 AM Performed by: Glynn Octave Authorized by: Glynn Octave  Consent: The procedure was performed in an emergent situation. Verbal consent obtained. Risks and benefits: risks, benefits and alternatives were discussed Consent given by: patient Patient identity confirmed: verbally with patient Time out: Immediately prior to procedure a "time out" was called to verify the correct patient, procedure, equipment, support staff and site/side marked as required. Local anesthesia used: no  Anesthesia: Local anesthesia used: no  Sedation: Patient sedated: yes Sedation type: moderate (conscious) sedation Sedatives: midazolam Analgesia: hydromorphone Sedation start date/time: 05/19/2017 7:29 AM Sedation end date/time: 05/19/2017 7:39 AM Vitals: Vital signs were monitored during sedation. Patient tolerance: Patient tolerated the procedure well with no immediate complications  .Sedation Date/Time: 05/19/2017 8:00 AM Performed by: Glynn Octave Authorized by: Glynn Octave   Consent:    Consent obtained:  Verbal and emergent situation   Consent given by:  Patient   Alternatives discussed:  Analgesia without sedation Universal protocol:    Immediately prior to procedure a time out was called: yes   Indications:    Procedure performed:  Dislocation reduction   Procedure necessitating sedation performed by:   Physician performing sedation   Intended level of sedation:  Moderate (conscious sedation) Pre-sedation assessment:    Time since last food or drink:  .   NPO status caution: unable to specify NPO status and urgency dictates proceeding with non-ideal NPO status     ASA classification: class 1 - normal, healthy patient     Neck mobility: normal     Mouth opening:  3 or more finger widths   Thyromental distance:  4 finger widths   Mallampati score:  I - soft palate, uvula, fauces, pillars visible   Pre-sedation assessments completed and reviewed: airway patency, cardiovascular function, mental status and pain level     Pre-sedation assessment completed:  05/19/2017 7:29 AM Immediate pre-procedure details:    Reassessment: Patient reassessed immediately prior to procedure     Reviewed: vital signs and relevant labs/tests     Verified: bag valve mask available, emergency equipment available and oxygen available   Procedure details (see MAR for exact dosages):    Preoxygenation:  Nasal cannula   Sedation:  Midazolam   Analgesia:  Hydromorphone   Intra-procedure monitoring:  Blood pressure monitoring and continuous pulse oximetry   Intra-procedure events: none     Total Provider sedation time (minutes):  10 Post-procedure details:  Post-sedation assessment completed:  05/19/2017 7:49 AM   Attendance: Constant attendance by certified staff until patient recovered     Recovery: Patient returned to pre-procedure baseline     Patient is stable for discharge or admission: yes     Patient tolerance:  Tolerated well, no immediate complications   (including critical care time)  Medications Ordered in ED Medications  fentaNYL (SUBLIMAZE) injection 50 mcg (50 mcg Intravenous Given 05/19/17 0704)  ondansetron (ZOFRAN) injection 4 mg (4 mg Intravenous Given 05/19/17 0704)  midazolam (VERSED) injection 2 mg (2 mg Intravenous Given 05/19/17 0726)  HYDROmorphone (DILAUDID) injection 2 mg (2 mg  Intravenous Given 05/19/17 0726)     Initial Impression / Assessment and Plan / ED Course  I have reviewed the triage vital signs and the nursing notes.  Pertinent labs & imaging results that were available during my care of the patient were reviewed by me and considered in my medical decision making (see chart for details).  Clinical Course as of May 19 745  Wed May 19, 2017  0744 DG Elbow Complete Right [TH]    Clinical Course User Index [TH] Lottie Rater    Patient with crush injury to right arm. There is possible open fracture. There is no palpable pulse.  Case discussed emergently with Dr. Amanda Pea of hand surgery. He will evaluate patient. Recommends involvement of vascular surgery. We'll also obtain portable x-rays. Right hand has decreased perfusion with pale skin and unable to palpate radial or ulnar pulse.  Case also discussed with Dr. Myra Gianotti vascular surgery.  X-rays none at bedside show posterior elbow dislocation. This was reduced emergently at bedside. Doppler pulses present after reduction.  Dr. Myra Gianotti requests CTA of RUE.   PAC Jeffery of orthopedics at bedside evaluating patient as well IV antibiotics given, tetanus updated. Dr. Hyacinth Meeker to assume care at shift change.   CRITICAL CARE Performed by: Glynn Octave Total critical care time: 35 minutes Critical care time was exclusive of separately billable procedures and treating other patients. Critical care was necessary to treat or prevent imminent or life-threatening deterioration. Critical care was time spent personally by me on the following activities: development of treatment plan with patient and/or surrogate as well as nursing, discussions with consultants, evaluation of patient's response to treatment, examination of patient, obtaining history from patient or surrogate, ordering and performing treatments and interventions, ordering and review of laboratory studies, ordering and review of  radiographic studies, pulse oximetry and re-evaluation of patient's condition.   Final Clinical Impressions(s) / ED Diagnoses   Final diagnoses:  Closed traumatic dislocation of posterior elbow joint, right, initial encounter  Crush injury arm, right, initial encounter    New Prescriptions New Prescriptions   No medications on file     Glynn Octave, MD 05/19/17 [phone]

## 2017-05-19 NOTE — ED Notes (Signed)
Xrays in progress. Unable to feel a radial pulse. Temporary reading of 91 on doppler screen but not audible with doppler.

## 2017-05-19 NOTE — ED Notes (Signed)
Patient transported to CT 

## 2017-05-19 NOTE — Anesthesia Procedure Notes (Signed)
Procedure Name: Intubation Date/Time: 05/19/2017 1:13 PM Performed by: Sampson Si E Pre-anesthesia Checklist: Patient identified, Emergency Drugs available, Suction available and Patient being monitored Patient Re-evaluated:Patient Re-evaluated prior to induction Oxygen Delivery Method: Circle System Utilized Preoxygenation: Pre-oxygenation with 100% oxygen Induction Type: IV induction Ventilation: Mask ventilation without difficulty Laryngoscope Size: Mac and 4 Tube type: Oral Tube size: 7.5 mm Number of attempts: 1 Airway Equipment and Method: Stylet and Oral airway Placement Confirmation: ETT inserted through vocal cords under direct vision,  positive ETCO2 and breath sounds checked- equal and bilateral Secured at: 23 cm Tube secured with: Tape Dental Injury: Teeth and Oropharynx as per pre-operative assessment  Comments: Performed by Paramedic student with supervision by CRNA and MDA

## 2017-05-19 NOTE — Transfer of Care (Signed)
Immediate Anesthesia Transfer of Care Note  Patient: Daniel Stewart  Procedure(s) Performed: Procedure(s): EXTERNAL FIXATION ARM (Right) IRRIGATION AND DEBRIDEMENT EXTREMITY/POSS. FAS/POSS. VAS (Right) BRACHIAL ARTERY REPAIR (Right)  Patient Location: PACU  Anesthesia Type:General  Level of Consciousness: drowsy  Airway & Oxygen Therapy: Patient Spontanous Breathing and Patient connected to nasal cannula oxygen  Post-op Assessment: Report given to RN  Post vital signs: Reviewed and stable  Last Vitals:  Vitals:   05/19/17 0732 05/19/17 0732  BP:  (!) 159/94  Pulse: (!) 102   Resp: 17   SpO2: 97%     Last Pain:  Vitals:   05/19/17 0639  PainSc: 9          Complications: No apparent anesthesia complications

## 2017-05-19 NOTE — ED Notes (Signed)
MD reviewing Xrays. Pt on continuous monitoring. 2L O2 via Nasal Cannula

## 2017-05-19 NOTE — ED Notes (Signed)
Report given to Susan RN at [facility] [employer] [facility]

## 2017-05-19 NOTE — Anesthesia Preprocedure Evaluation (Signed)
Anesthesia Evaluation  Patient identified by MRN, date of birth, ID band Patient awake    Reviewed: Allergy & Precautions, NPO status , Patient's Chart, lab work & pertinent test results  Airway Mallampati: II  TM Distance: >3 FB Neck ROM: Full    Dental no notable dental hx.    Pulmonary neg pulmonary ROS, former smoker,    Pulmonary exam normal breath sounds clear to auscultation       Cardiovascular negative cardio ROS Normal cardiovascular exam Rhythm:Regular Rate:Normal     Neuro/Psych negative neurological ROS  negative psych ROS   GI/Hepatic negative GI ROS, Neg liver ROS,   Endo/Other  negative endocrine ROS  Renal/GU negative Renal ROS  negative genitourinary   Musculoskeletal negative musculoskeletal ROS (+)   Abdominal   Peds negative pediatric ROS (+)  Hematology negative hematology ROS (+)   Anesthesia Other Findings   Reproductive/Obstetrics negative OB ROS                             Anesthesia Physical Anesthesia Plan  ASA: II  Anesthesia Plan: General   Post-op Pain Management:    Induction: Intravenous  PONV Risk Score and Plan: 2 and Ondansetron, Midazolam and Treatment may vary due to age or medical condition  Airway Management Planned: LMA  Additional Equipment:   Intra-op Plan:   Post-operative Plan: Extubation in OR  Informed Consent: I have reviewed the patients History and Physical, chart, labs and discussed the procedure including the risks, benefits and alternatives for the proposed anesthesia with the patient or authorized representative who has indicated his/her understanding and acceptance.     Dental advisory given  Plan Discussed with: CRNA  Anesthesia Plan Comments:         Anesthesia Quick Evaluation  

## 2017-05-19 NOTE — OR Nursing (Addendum)
One blue vessel loop used for closure technique placed by Dr. Jena Gauss, to be taken out at a later time. Colonel Bald, RN; Youlanda Roys, RN; April Green, RN; and Garlon Hatchet, RN and Dr. Jena Gauss are all aware of incorrect count and where the vessel loop was placed.

## 2017-05-19 NOTE — ED Notes (Signed)
Attempted 2nd IV X2/blood draw.

## 2017-05-19 NOTE — H&P (Signed)
Daniel Stewart is an 51 y.o. male.   Chief Complaint: Right arm injury HPI: Federico was working when his arm got pulled into a press. He came in to the ED for evaluation and was found to have a pulseless and paresthetic hand. X-rays showed an elbow dislocation. This was relocated under conscious sedation after which he had a dopplerable radial pulse. His forearm remains tense and his hand paresthetic. He was evaluated by Dr. Myra Gianotti from vascular surgery. He is LHD.  No past medical history on file.  No past surgical history on file.  Family History  Problem Relation Age of Onset  . Diabetes Mother   . Cancer Father    Social History:  reports that he has quit smoking. He has never used smokeless tobacco. His alcohol and drug histories are not on file.  Allergies: No Known Allergies  No results found for this or any previous visit (from the past 48 hour(s)). No results found.  Review of Systems  Constitutional: Negative for weight loss.  HENT: Negative for ear discharge, ear pain, hearing loss and tinnitus.   Eyes: Negative for blurred vision, double vision, photophobia and pain.  Respiratory: Negative for cough, sputum production and shortness of breath.   Cardiovascular: Negative for chest pain.  Gastrointestinal: Negative for abdominal pain, nausea and vomiting.  Genitourinary: Negative for dysuria, flank pain, frequency and urgency.  Musculoskeletal: Positive for joint pain (Right elbow). Negative for back pain, falls, myalgias and neck pain.  Neurological: Positive for sensory change (Right hand/forearm). Negative for dizziness, tingling, focal weakness, loss of consciousness and headaches.  Endo/Heme/Allergies: Does not bruise/bleed easily.  Psychiatric/Behavioral: Negative for depression, memory loss and substance abuse. The patient is not nervous/anxious.     Blood pressure (!) 159/94, pulse (!) 102, resp. rate 17, SpO2 97 %. Physical Exam  Constitutional: He  appears well-developed and well-nourished. No distress.  HENT:  Head: Normocephalic.  Eyes: Conjunctivae are normal. Right eye exhibits no discharge. Left eye exhibits no discharge. No scleral icterus.  Cardiovascular: Normal rate and regular rhythm.   Respiratory: Effort normal. No respiratory distress.  Musculoskeletal:  Right shoulder, elbow, wrist, digits- Transverse lacerations across posterior arm, most significant about 2cm distal to elbow, TTP, no instability, no blocks to motion once relocated, forearm tense  Sens  Ax/R/M/U paresthetic  Mot   Ax/ R/ PIN/ M/ AIN/ U intact  Rad Dopplerable  Neurological: He is alert.  Skin: Skin is warm and dry. He is not diaphoretic.  Psychiatric: He has a normal mood and affect. His behavior is normal.     Assessment/Plan RUE crush injury with elbow dislocation and multiple lacerations -- To OR for I&D, possible fasciotomies and ex fix by Dr. Jena Gauss. NPO until then. Possible brachial artery injury -- CTA pending, Dr. Myra Gianotti involved    Freeman Caldron, PA-C Orthopedic Surgery [phone] 05/19/2017, 7:48 AM

## 2017-05-19 NOTE — Consult Note (Signed)
[facility]  Patient name: Daniel Stewart MRN: [id number] DOB: 1966-02-10 Sex: male   REQUESTING PROVIDER:    ER   REASON FOR CONSULT:    Ischemic right arm following work accident  HISTORY OF PRESENT ILLNESS:   Daniel Stewart is a 51 y.o. male, who presented to the ED following an injury at work.  He states his arm got caught in a machine.  He has numbness in his right arm  PAST MEDICAL HISTORY    none  FAMILY HISTORY   Family History  Problem Relation Age of Onset  . Diabetes Mother   . Cancer Father     SOCIAL HISTORY:   Social History   Social History  . Marital status: Unknown    Spouse name: N/A  . Number of children: N/A  . Years of education: N/A   Occupational History  . Not on file.   Social History Main Topics  . Smoking status: Former Games developer  . Smokeless tobacco: Never Used  . Alcohol use Not on file  . Drug use: Unknown  . Sexual activity: Not on file   Other Topics Concern  . Not on file   Social History Narrative  . No narrative on file    ALLERGIES:    No Known Allergies  CURRENT MEDICATIONS:    Current Facility-Administered Medications  Medication Dose Route Frequency Provider Last Rate Last Dose  . HYDROmorphone (DILAUDID) injection 2 mg  2 mg Intravenous Once Rancour, Stephen, MD      . midazolam (VERSED) injection 2 mg  2 mg Intravenous Once Rancour, Stephen, MD       Current Outpatient Prescriptions  Medication Sig Dispense Refill  . chlorhexidine (PERIDEX) 0.12 % solution Use as directed 15 mLs in the mouth or throat 4 (four) times daily. (Patient not taking: Reported on 02/29/2016) 473 mL 0  . indomethacin (INDOCIN) 50 MG capsule Take 1 capsule (50 mg total) by mouth 2 (two) times daily with a meal. (Patient not taking: Reported on 04/25/2016) 60 capsule 1  . PARoxetine (PAXIL) 20 MG tablet Take 1 tablet (20 mg total) by mouth daily.  (Patient not taking: Reported on 02/29/2016) 30 tablet 0  . ranitidine (ZANTAC) 150 MG tablet Take 1 tablet (150 mg total) by mouth at bedtime. 10 tablet 0    REVIEW OF SYSTEMS:    denotes positive finding,  denotes negative finding Cardiac  Comments:  Chest pain or chest pressure:    Shortness of breath upon exertion:    Short of breath when lying flat:    Irregular heart rhythm:        Vascular    Pain in calf, thigh, or hip brought on by ambulation:    Pain in feet at night that wakes you up from your sleep:     Blood clot in your veins:    Leg swelling:         Pulmonary    Oxygen at home:    Productive cough:     Wheezing:         Neurologic    Sudden weakness in arms or legs:     Sudden numbness in arms or legs:  x   Sudden onset of difficulty speaking or slurred speech:    Temporary loss of vision in one eye:     Problems with dizziness:         Gastrointestinal    Blood in stool:  Vomited blood:         Genitourinary    Burning when urinating:     Blood in urine:        Psychiatric    Major depression:         Hematologic    Bleeding problems:    Problems with blood clotting too easily:        Skin    Rashes or ulcers:        Constitutional    Fever or chills:     PHYSICAL EXAM:   Vitals:   05/19/17 0632  BP: (!) 161/89  Pulse: 97  Resp: 18  SpO2: 98%    GENERAL: The patient is a well-nourished male, in no acute distress. The vital signs are documented above. CARDIAC: There is a regular rate and rhythm.  VASCULAR: no ulnar or radial pulse/ doppler signal prior to elbow reduction.  Doppler radial signal after reduction.  Ulnar not dopplerable PULMONARY: Nonlabored respirations ABDOMEN: Soft and non-tender. Abrasion to abdomen  MUSCULOSKELETAL: There are no major deformities or cyanosis. NEUROLOGIC: No focal weakness or paresthesias are detected. SKIN: There are no ulcers or rashes noted. PSYCHIATRIC: The patient has a normal  affect.  STUDIES:   CTA shows brachial artery occlusion  ASSESSMENT and PLAN   Discussed with patient and family proceeding to OR for brachial artery exploration and repair.  All questions answered.  After elbow reduction, the hand became warm and he had good grip strength with decreased numbness   Durene Cal, MD [facility] [phone] Pager [phone]

## 2017-05-19 NOTE — Brief Op Note (Signed)
05/19/2017  7:43 PM  PATIENT:  Daniel Stewart  51 y.o. male  PRE-OPERATIVE DIAGNOSIS:  INFECTED RIGHT ELBOW  POST-OPERATIVE DIAGNOSIS:  * No post-op diagnosis entered *  PROCEDURE:  Procedure(s): EXTERNAL FIXATION ARM (Right) IRRIGATION AND DEBRIDEMENT EXTREMITY/POSS. FAS/POSS. VAS (Right) BRACHIAL ARTERY REPAIR (Right)  SURGEON:  Surgeon(s) and Role: Panel 1:    * Haddix, Gillie Manners, MD - Primary  Panel 2:    * Nada Libman, MD - Primary  PHYSICIAN ASSISTANT:   ASSISTANTS: Eleanor   ANESTHESIA:   general  EBL:  No intake/output data recorded.  BLOOD ADMINISTERED:none  DRAINS: none   LOCAL MEDICATIONS USED:  NONE  SPECIMEN:  No Specimen  DISPOSITION OF SPECIMEN:  N/A  COUNTS:  YES  TOURNIQUET:  * No tourniquets in log *  DICTATION: .Dragon Dictation  PLAN OF CARE: Admit to inpatient   PATIENT DISPOSITION:  PACU - hemodynamically stable.   Delay start of Pharmacological VTE agent (>24hrs) due to surgical blood loss or risk of bleeding: yes

## 2017-05-19 NOTE — Progress Notes (Signed)
Orthopedic Tech Progress Note Patient Details:  Daniel Stewart April 08, 1966 [id number]  Patient ID: Daniel Stewart, male   DOB: 12-11-1965, 51 y.o.   MRN: [id number]   [name] 05/19/2017, 10:31 AM RN stated that pt would not need long arm splint to be applied by ortho tech because pt would be transported to OR for surgery

## 2017-05-19 NOTE — ED Notes (Signed)
R elbow back in place, pt on continuous monitoring and on 2L O2 via Nasal Cannula prophalactically. NS infusion hung by EMS remains infusing for a total of 1L NS. Pt alert and talking to verbal stimuli.

## 2017-05-19 NOTE — Op Note (Signed)
OrthopaedicSurgeryOperativeNote (IFO:[id number]) Date of Surgery: 05/19/2017  Admit Date: 05/19/2017   Diagnoses: Pre-Op Diagnoses: Right open elbow dislocation with brachial artery injury  Post-Op Diagnosis: Same  Procedures: 1. CPT 24101-I&D of right open elbow joint 2. CPT 24615-Treatment of open elbow dislocation 3. CPT 24345-Repair of medial elbow ligaments 4. CPT 24343-Repair of lateral elbow ligaments 5. CPT 20690-External fixation of right elbow 6. CPT 25020-Decompression of forearm (fasciotomy) 7. CPT 12032-Repair of traumatic wound to elbow 8. CPT 97605-Wound vac placement  Surgeons: Primary: Shona Needles, MD   Location:[facility] OR ROOM 03   AnesthesiaGeneral   Antibiotics:Ancef 2g preop   Tourniquettime: None used for orthopaedic portion of procedure  EstimatedBloodLoss:100 mL for orthopaedic portion   Complications: None  Specimens: None  Implants:  Implant Name Type Inv. Item Serial No. Manufacturer Lot No. LRB No. Used Action  KIT DISPOSABLE J-KNOT SZ1 - [id number] Anchor KIT DISPOSABLE J-KNOT SZ1   ZIMMER CAROLINAS [id number] Right 2 Implanted    IndicationsforSurgery: 65QH ambidextrious individual with crush injury to right elbow with mechanical press. Presented with no doppler to radial artery and elbow dislocation. He was reduced and a doppler pulse returned but he had no flow to his ulnar side. His forearm was very swollen and was concerned for compartment syndrome. He has full sensation and motor function to his hand. He has no pain with passive stretch. He has a traumatic laceration on the posterior aspect of his elbow that is about 4 cm in length.  I reviewed his imaging and he had an elbow dislocation that appears concentrically reduced. On his CTA he has a brachial artery injury that reconstitutes to the radial artery distally but not to the ulnar artery. I discussed with Dr. Trula Slade and he will plan to proceed with bypassing to restore full  flow to the arm. I will plan to irrigate and debride his open wound and range his elbow. If he has significant instability will plan for external fixation of the elbow. I will also plan for prophylactic fasciotomies of the forearm with plan for return to the operating room in a few days for closure.  Discussed risks and benefits with patient and his wife. Risks included bleeding, infection, elbow stiffness, elbow instability, nerve and blood vessel injury, compartment syndrome, even possibility of loss of limb. In light of these risks he wishes to proceed. Consent was obtained.. Risks and benefits were extensively discussed as noted above and the patient and their family agreed to proceed with surgery and consent was obtained.  Operative Findings: 1. Brachial artery vascular bypass done by Dr. Trula Slade, please see his operative report for full details. 2. Traumatic open elbow dislocation with lateral open wound approximately 4 cm in length. Rupture of lateral and medial elbow ligaments and anterior capsule with grossly unstable elbow injury. 3. I&D of right open elbow dislocation 4. Repair of LUCL and MCL of elbow with Zimmer suture anchor on each side 5. Spanning elbow fixation with Zimmer Xtrafix with two 5.77m pins in humerus and two, 4.056mpins in elbow, stabilized at 90 degrees at end of case. 6. Prophylactic forearm fasciotomy for prevention of revascularization injury and compartment syndrome with wound vac placement 7. Primary closure of traumatic elbow arthrotomy  Procedure: The patient was identified in the preoperative holding area. Consent was confirmed with the patient and their family and all questions were answered. The operative extremity was marked after confirmation with the patient. he was then brought back to the operating room by our anesthesia colleagues.  He was placed under general anesthesia and transferred to a regular OR table. His right upper extremity was placed on an arm  board and his right lower extremity was prepped in addition to his right upper extremity in usual sterile fashion. This was done for vein harvest for the vascular surgeon. At this point the patient was turned over to Dr. Trula Slade for his bypass grafting. Once he was finished, I came in to perform the orthopaedic portion of the procedure.  I first started by exploring the incision that Dr. Trula Slade had made. He had exposed the medial side of the arm and the entire anterior capsule was torn off the ulna and radius and the MCL had avulsed from the medial epicondyle. The lateral 4 cm wound was palpated and it clearly had violated the elbow joint with palpation of the radiocapitellum joint. The whole lateral ligament complex was torn. I extended the traumatic incision into a lateral approach to the elbow. The mobile wad was torn off the humerus and the LUCL was avulsed off the humerus as well. At this point I irrigated the joint with low pressure pulsatile lavage with 3L of normal saline.  Once the joint was thoroughly irrigated, I turned to placing the ex-fix pin to the humerus and ulna. A ruler was used to measure 10cm above the lateral epicondyle to prevent damage to the radial nerve. A percutaneous incision was made and a hemostat was used to spread down to bone. I predrilled and placed bicortical 5.67m thread half pins into the humerus. Two were placed using a pin clamp as a guide. The clamp was then tightened to the pins. The process was repeated with the proximal ulna and 4.078mthreaded half pins. A pin clamp was tightened to these pins as well.  Once the pins were in place I focused on repairing the lateral side. The LUCL had avulsed off of the lateral humeral epicondyle. I used a Zimmer Jugger-Knot suture anchor to place at the origin of the LUCL on the humeral epicondyle. It was predrilled and malleted in place and the sutures were used to throw a number of passes through the ligament and it was tied down  to the epicondyle. The sutures were then used to tied down some of the avulsed mobile wad to the lateral epicondyle. I felt I was able to get a decent repair of there lateral side.  I then focused on the medial side. Again, there was complete avulsion of the anterior capsule off the ulna and the UCL appeared to have avulsed off the medial epicondyle. The ligament was stout and I used a JuggerKnot suture anchor to repair this ligament. Some of the avulsed flexor musculature was tied down with the sutures as well.  At this portion of the procedure the elbow was much more stable and I did not want to stress the repair to test the stability. With the arm now revascularized, I felt that the most appropriate course of action would be to perform a prophylac forearm fasciotomy. The volar incision from the vascular repair was extended along the extent of the volar forearm. The subcutaneous fat was incised with the skin. The fascia over the muscle was incised along the length of the incision to decompress the underlying muscle. There was no concern of compartment syndrome at this point. I used a Roman sandal technique with staples and a vessel loop to provide tension to the skin for ease of closure at next OR visit.   A ex-fix  bar was used to connect the two pin clamps to stabilize the elbow in 90 degrees of flexion. Final fluoroscopic images were obtained which showed a concentrically reduced elbow joint. The incisions and wounds were irrigated with normal saline. The medial incision for the vascular repair was closed with 2-0 monocryl and 3-0 nylon. The lateral traumatic wound and extension was closed with the same. The fasciotomy wound had a black granulofoam sponge and was connected to 152mHg. The remainder of the incisions were dressed with bacitracin ointment, adaptec, 4x4s, kerlix and an ACE wrap. He was then awoken from anesthesia and taken to the PACU in stable condition.  Post Op Plan/Instructions: The  patient will be nonweightbearing the the right upper extremity. He will remain in his ex-fix. We will return to the OR in the next 2-3 days for washout and closure of the fasciotomy wound with possible transition to a hinged external fixator. He will receive postoperative ancef, DVT prophylaxis will be aspirin.  I was present and performed the entire surgery.  KKatha Hamming MD [facility]

## 2017-05-19 NOTE — ED Triage Notes (Addendum)
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 Deformity at the elbow, laceration, possible forearm fracture. Arm is splinted.  20G LFA. 400 NS given. 4L O2 placed by EMS. No pain medications given by EMS. Work comp pt. Will need UDS. Supervisor in lobby. Per ems. Contusion to right breast. EMS states diminished lung sounds to right.    Upon assessment pt right hand has decreased perfusion, skin tone is more pale compared to left. Pt reports he does have full sensation to his finger tips.

## 2017-05-20 LAB — CBC
HCT: 27.2 % — ABNORMAL LOW (ref 39.0–52.0)
HEMOGLOBIN: 8.8 g/dL — AB (ref 13.0–17.0)
MCH: 26.3 pg (ref 26.0–34.0)
MCHC: 32.4 g/dL (ref 30.0–36.0)
MCV: 81.2 fL (ref 78.0–100.0)
Platelets: 221 10*3/uL (ref 150–400)
RBC: 3.35 MIL/uL — ABNORMAL LOW (ref 4.22–5.81)
RDW: 13 % (ref 11.5–15.5)
WBC: 15.6 10*3/uL — AB (ref 4.0–10.5)

## 2017-05-20 LAB — HIV ANTIBODY (ROUTINE TESTING W REFLEX): HIV Screen 4th Generation wRfx: NONREACTIVE

## 2017-05-20 MED ORDER — ASPIRIN 81 MG PO CHEW
81.0000 mg | CHEWABLE_TABLET | Freq: Every day | ORAL | Status: DC
  Administered 2017-05-20 – 2017-05-25 (×5): 81 mg via ORAL
  Filled 2017-05-20 (×5): qty 1

## 2017-05-20 MED ORDER — CHLORHEXIDINE GLUCONATE 4 % EX LIQD: 60.0000 mL | Freq: Once | CUTANEOUS | Status: DC

## 2017-05-20 MED ORDER — ENOXAPARIN SODIUM 40 MG/0.4ML ~~LOC~~ SOLN
40.0000 mg | SUBCUTANEOUS | Status: DC
  Administered 2017-05-20 – 2017-05-25 (×5): 40 mg via SUBCUTANEOUS
  Filled 2017-05-20 (×6): qty 0.4

## 2017-05-20 MED ORDER — CEFAZOLIN SODIUM-DEXTROSE 2-4 GM/100ML-% IV SOLN: 2.0000 g | INTRAVENOUS | Status: DC

## 2017-05-20 MED ORDER — ACETAMINOPHEN 325 MG PO TABS
650.0000 mg | ORAL_TABLET | Freq: Four times a day (QID) | ORAL | Status: DC | PRN
Start: 2017-05-20 — End: 2017-05-25
  Administered 2017-05-22 – 2017-05-24 (×4): 650 mg via ORAL
  Filled 2017-05-20 (×4): qty 2

## 2017-05-20 MED ORDER — POVIDONE-IODINE 10 % EX SWAB: 2.0000 "application " | Freq: Once | CUTANEOUS | Status: DC

## 2017-05-20 MED ORDER — ACETAMINOPHEN 650 MG RE SUPP: 650.0000 mg | Freq: Four times a day (QID) | RECTAL | Status: DC | PRN

## 2017-05-20 MED ORDER — CEFAZOLIN SODIUM-DEXTROSE 2-4 GM/100ML-% IV SOLN
2.0000 g | Freq: Four times a day (QID) | INTRAVENOUS | Status: AC
  Administered 2017-05-20 (×3): 2 g via INTRAVENOUS
  Filled 2017-05-20 (×4): qty 100

## 2017-05-20 NOTE — Progress Notes (Signed)
Orthopaedic Trauma Progress Note  S: Doing well this AM, having some pain but overall is controlled  O:  Vitals:   05/19/17 2230 05/20/17 0221  BP:  (!) 145/81  Pulse:  (!) 103  Resp:  17  Temp: 99.5 F (37.5 C) 98.8 F (37.1 C)  SpO2:  95%  RUE: ACE wrap in place, clean dry and intact. Exfix in place with pin sites clean dry and intact. Motor and sensory function in median, ulnar and radial nerve. Warm and well perfused had. Wound vac with good suction  Imaging: Post op elbow films with well reduced elbow  Labs: Hgb 55.72  A/P: 51 year old male with right open elbow dislocation with brachial artery injury status post bypass by Dr. Myra Gianotti and LUCL/MCL repair with external fixation of elbow with prophylactic fasciotomies  -NWB -Sling for comfort -PT/OT, discontinue foley after up with therapy -Return to OR likely Saturday for closure of fasciotomy wound and possible hinged ex-fix  Roby Lofts, MD [facility] [phone] (phone)

## 2017-05-20 NOTE — Progress Notes (Signed)
Orthopedic Tech Progress Note Patient Details:  Daniel Stewart [birth year] [id number]  Ortho Devices Type of Ortho Device: Arm sling Ortho Device/Splint Location: rue Ortho Device/Splint Interventions: Application   [name] 05/20/2017, 10:32 AM

## 2017-05-20 NOTE — Anesthesia Postprocedure Evaluation (Signed)
Anesthesia Post Note  Patient: Vignesh S Pry  Procedure(s) Performed: Procedure(s) (LRB): EXTERNAL FIXATION ARM (Right) IRRIGATION AND DEBRIDEMENT EXTREMITY/POSS. FAS/POSS. VAS (Right) BRACHIAL ARTERY REPAIR (Right)     Patient location during evaluation: PACU Anesthesia Type: General Level of consciousness: awake and alert Pain management: pain level controlled Vital Signs Assessment: post-procedure vital signs reviewed and stable Respiratory status: spontaneous breathing, nonlabored ventilation, respiratory function stable and patient connected to nasal cannula oxygen Cardiovascular status: blood pressure returned to baseline and stable Postop Assessment: no apparent nausea or vomiting Anesthetic complications: no    Last Vitals:  Vitals:   05/19/17 2230 05/20/17 0221  BP:  (!) 145/81  Pulse:  (!) 103  Resp:  17  Temp: 37.5 C 37.1 C  SpO2:  95%    Last Pain:  Vitals:   05/20/17 0607  TempSrc:   PainSc: 2                  Jaquon Gingerich S

## 2017-05-20 NOTE — Progress Notes (Signed)
    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  Median nerve visualized throughout and was intact  Would add ASA to medication regimen at discharge  Durene Cal 05/20/2017 7:58 AM --  Vitals:   05/19/17 2230 05/20/17 0221  BP:  (!) 145/81  Pulse:  (!) 103  Resp:  17  Temp: 99.5 F (37.5 C) 98.8 F (37.1 C)  SpO2:  95%    Intake/Output Summary (Last 24 hours) at 05/20/17 0758 Last data filed at 05/20/17 0600  Gross per 24 hour  Intake          3017.92 ml  Output             1576 ml  Net          1441.92 ml     Laboratory CBC    Component Value Date/Time   WBC 15.2 (H) 05/19/2017 0940   HGB 11.9 (L) 05/19/2017 1003   HCT 35.0 (L) 05/19/2017 1003   PLT 239 05/19/2017 0940    BMET    Component Value Date/Time   NA 141 05/19/2017 1003   K 3.9 05/19/2017 1003   CL 107 05/19/2017 1003   CO2 26 10/23/2014 0957   GLUCOSE 130 (H) 05/19/2017 1003   BUN 25 (H) 05/19/2017 1003   CREATININE 0.90 05/19/2017 1003   CREATININE 0.88 10/23/2014 0957   CALCIUM 9.9 10/23/2014 0957   GFRNONAA >60 05/19/2017 0940   GFRAA >60 05/19/2017 0940    COAG No results found for: INR, PROTIME No results found for: PTT  Antibiotics Anti-infectives    Start     Dose/Rate Route Frequency Ordered Stop   05/20/17 0800  ceFAZolin (ANCEF) IVPB 2g/100 mL premix     2 g 200 mL/hr over 30 Minutes Intravenous Every 6 hours 05/20/17 0717 05/21/17 0159   05/20/17 0717  ceFAZolin (ANCEF) IVPB 2g/100 mL premix  Status:  Discontinued     2 g 200 mL/hr over 30 Minutes Intravenous On call to O.R. 05/20/17 0717 05/20/17 0746   05/19/17 0800  ceFAZolin (ANCEF) IVPB 2g/100 mL premix     2 g 200 mL/hr over 30 Minutes  Intravenous  Once 05/19/17 0747 05/19/17 1315       V. Charlena Cross, M.D. [facility] [place] Office: [phone] Pager:  [phone]

## 2017-05-20 NOTE — Evaluation (Signed)
Physical Therapy Evaluation Patient Details Name: Daniel Stewart MRN: [id number] DOB: [birth year] Today's Date: 05/20/2017   History of Present Illness  Pt is a 51 y.o. male brought to [facility] following crush injury to his right arm at work. Pt with brachial artery injury s/p bypass and LUCL/MCL repair with external fixations of elbow with prophylactic fasciotomies. No PMH on file.     Clinical Impression  Pt presents with RUE pain and an overall decrease in functional mobility secondary to above. PTA, pt indep with all mobility and lives with family who can provide 24/7 assist. Reviewed RUE NWB precautions, positioning, fall risk reduction, and importance of mobility. Today, pt able to progress to mod indep with transfers and amb; requiring minA for ascended/descending steps with LUE support secondary to pt having no rail at home (wife present and reports will be able to provide this assist at home if needed). Pt encouraged to amb in hallway with assist for line management. All education completed and pt has no further questions. Feel pt has no acute PT needs at this time; acute OT currently following.     Follow Up Recommendations No PT follow up;Supervision - Intermittent    Equipment Recommendations  None recommended by PT    Recommendations for Other Services       Precautions / Restrictions Precautions Precautions: None Restrictions Weight Bearing Restrictions: Yes RUE Weight Bearing: Non weight bearing      Mobility  Bed Mobility Overal bed mobility: Needs Assistance Bed Mobility: Supine to Sit     Supine to sit: Supervision     General bed mobility comments: Received sitting in chair  Transfers Overall transfer level: Modified independent Equipment used: None Transfers: Sit to/from Stand Sit to Stand: Modified independent (Device/Increase time)            Ambulation/Gait Ambulation/Gait assistance: Modified independent (Device/Increase time) Ambulation  Distance (Feet): 600 Feet Assistive device: None Gait Pattern/deviations: WFL(Within Functional Limits)     General Gait Details: Amb initially with supervision, as pt reports slight unsteadiness with RLE soreness where he had graft. Progressed to mod indep with normalized gait pattern. Pt reports RUE and R rib pain with mobility  Stairs Stairs: Yes Stairs assistance: Min assist Stair Management: No rails;Alternating pattern;Forwards Number of Stairs: 3 General stair comments: Ascended/descended steps with LUE support, wife present and educ on technique; both state she will be able to provide assist at home if needed. MinA for UE support and line management  Wheelchair Mobility    Modified Rankin (Stroke Patients Only)       Balance Overall balance assessment: Needs assistance Sitting-balance support: No upper extremity supported;Feet supported Sitting balance-Leahy Scale: Good     Standing balance support: No upper extremity supported;During functional activity Standing balance-Leahy Scale: Good Standing balance comment: Pt able to complete funtional mobility and reports feeling a little off balance due to RUE.              High level balance activites: Side stepping;Backward walking;Direction changes;Turns;Sudden stops               Pertinent Vitals/Pain Pain Assessment: Faces Pain Score: 5  Faces Pain Scale: Hurts little more Pain Location: Arm Pain Descriptors / Indicators: Aching;Discomfort;Sore Pain Intervention(s): Limited activity within patient's tolerance;Monitored during session;Ice applied    Home Living Family/patient expects to be discharged to:: Private residence Living Arrangements: Spouse/significant other;Children Available Help at Discharge: Family;Available PRN/intermittently Type of Home: House Home Access: Stairs to enter Entrance Stairs-Rails: None Entrance  Stairs-Number of Steps: 3 Home Layout: One level Home Equipment: None       Prior Function Level of Independence: Independent         Comments: Pt works full time and has two children. Enjoys fishing and playing with his kids. Pt motiviated to return to PLOF.      Hand Dominance   Dominant Hand: Left (Ambidextrous)    Extremity/Trunk Assessment   Upper Extremity Assessment Upper Extremity Assessment: RUE deficits/detail RUE Deficits / Details: RUE immobilized s/p crush injury. RUE: Unable to fully assess due to immobilization RUE Sensation:  (reports numbess in hand. limited ROM in hand.)    Lower Extremity Assessment Lower Extremity Assessment: Overall WFL for tasks assessed    Cervical / Trunk Assessment Cervical / Trunk Assessment: Normal  Communication   Communication: No difficulties  Cognition Arousal/Alertness: Awake/alert Behavior During Therapy: WFL for tasks assessed/performed Overall Cognitive Status: Within Functional Limits for tasks assessed                                        General Comments General comments (skin integrity, edema, etc.): Use of sling to support RUE during functional mobility. Pt motivated to return to PLOF.     Exercises     Assessment/Plan    PT Assessment Patent does not need any further PT services  PT Problem List         PT Treatment Interventions      PT Goals (Current goals can be found in the Care Plan section)  Acute Rehab PT Goals Patient Stated Goal: To return to PLOF  PT Goal Formulation: With patient Time For Goal Achievement: 06/03/17 Potential to Achieve Goals: Good    Frequency     Barriers to discharge        Co-evaluation               AM-PAC PT "6 Clicks" Daily Activity  Outcome Measure Difficulty turning over in bed (including adjusting bedclothes, sheets and blankets)?: None Difficulty moving from lying on back to sitting on the side of the bed? : None Difficulty sitting down on and standing up from a chair with arms (e.g., wheelchair,  bedside commode, etc,.)?: None Help needed moving to and from a bed to chair (including a wheelchair)?: None Help needed walking in hospital room?: None Help needed climbing 3-5 steps with a railing? : A Little 6 Click Score: 23    End of Session Equipment Utilized During Treatment: Gait belt Activity Tolerance: Patient tolerated treatment well Patient left: in chair;with call bell/phone within reach;with family/visitor present Nurse Communication: Mobility status PT Visit Diagnosis: Other abnormalities of gait and mobility (R26.89);Pain Pain - Right/Left: Right Pain - part of body: Arm;Shoulder    Time: 1610-9604 PT Time Calculation (min) (ACUTE ONLY): 15 min   Charges:   PT Evaluation $PT Eval Low Complexity: 1 Low     PT G Codes:       Ina Homes, PT, DPT Acute Rehab Services  Pager: [phone]  [name] 05/20/2017, 4:57 PM

## 2017-05-20 NOTE — Therapy (Signed)
Occupational Therapy Evaluation Patient Details Name: Daniel Stewart MRN: [id number] DOB: 1966/05/22 Today's Date: 05/20/2017    History of Present Illness Pt is a 51 y.o. male brought to [facility] following crush injury to his right arm at work. Pt with brachial artery injury s/p bypass and LUCL/MCL repair with external fixations of elbow with prophylactic fasciotomies. No PMH on file.    Clinical Impression   PTA, pt was working full time, independent in all ADLs and IADLs. Currently, pt requires moderate assistance for UB ADLs, and min assist for LB ADLs, functional mobility and transfers. Pt reports family is available upon d/c to assist as needed. OT will follow acutely to address established goals.     Follow Up Recommendations  DC plan and follow up therapy as arranged by surgeon;Supervision/Assistance - 24 hour    Equipment Recommendations  None recommended by OT    Recommendations for Other Services       Precautions / Restrictions Precautions Precautions: None Restrictions Weight Bearing Restrictions: Yes RUE Weight Bearing: Non weight bearing      Mobility Bed Mobility Overal bed mobility: Needs Assistance Bed Mobility: Supine to Sit     Supine to sit: Supervision     General bed mobility comments: Supervision to manage lines  Transfers Overall transfer level: Needs assistance   Transfers: Sit to/from Stand Sit to Stand: Min assist              Balance Overall balance assessment: Needs assistance Sitting-balance support: No upper extremity supported;Feet supported Sitting balance-Leahy Scale: Good     Standing balance support: No upper extremity supported;During functional activity Standing balance-Leahy Scale: Good Standing balance comment: Pt able to complete funtional mobility and reports feeling a little off balance due to RUE.                            ADL either performed or assessed with clinical judgement   ADL Overall  ADL's : Needs assistance/impaired     Grooming: Supervision/safety;Standing   Upper Body Bathing: Moderate assistance   Lower Body Bathing: Minimal assistance;Sit to/from stand   Upper Body Dressing : Moderate assistance;Sitting   Lower Body Dressing: Minimal assistance;Sit to/from stand   Toilet Transfer: Minimal assistance;Ambulation   Toileting- Clothing Manipulation and Hygiene: Supervision/safety;Sit to/from stand   Tub/ Shower Transfer: Minimal assistance;Ambulation   Functional mobility during ADLs: Minimal assistance       Vision         Perception     Praxis      Pertinent Vitals/Pain Pain Assessment: 0-10 Pain Score: 5  Pain Location: Arm Pain Descriptors / Indicators: Aching;Discomfort;Sore Pain Intervention(s): Monitored during session     Hand Dominance Left (Pt ambidextrous. Eats with left, writes with right. )   Extremity/Trunk Assessment Upper Extremity Assessment Upper Extremity Assessment: RUE deficits/detail RUE Deficits / Details: RUE immobilized s/p crush injury. RUE: Unable to fully assess due to immobilization RUE Sensation:  (reports numbess in hand. limited ROM in hand.)   Lower Extremity Assessment Lower Extremity Assessment: Defer to PT evaluation   Cervical / Trunk Assessment Cervical / Trunk Assessment: Normal   Communication Communication Communication: No difficulties   Cognition Arousal/Alertness: Awake/alert Behavior During Therapy: WFL for tasks assessed/performed Overall Cognitive Status: Within Functional Limits for tasks assessed  General Comments  Use of sling to support RUE during functional mobility. Pt motivated to return to PLOF.     Exercises     Shoulder Instructions      Home Living Family/patient expects to be discharged to:: Private residence Living Arrangements: Spouse/significant other;Children Available Help at Discharge: Family;Available  PRN/intermittently Type of Home: House Home Access: Stairs to enter [employer] of Steps: 3 Entrance Stairs-Rails: None Home Layout: One level     Bathroom Shower/Tub: IT trainer: Standard     Home Equipment: None          Prior Functioning/Environment Level of Independence: Independent        Comments: Pt works full time and has two children. Enjoys fishing and playing with his kids. Pt motiviated to return to PLOF.         OT Problem List: Decreased range of motion;Decreased activity tolerance;Impaired balance (sitting and/or standing);Decreased knowledge of precautions;Impaired sensation;Pain      OT Treatment/Interventions: Self-care/ADL training;DME and/or AE instruction;Cognitive remediation/compensation;Therapeutic activities;Balance training;Patient/family education    OT Goals(Current goals can be found in the care plan section) Acute Rehab OT Goals Patient Stated Goal: To return to PLOF  OT Goal Formulation: With patient Time For Goal Achievement: 05/20/17 Potential to Achieve Goals: Good  OT Frequency: Min 3X/week   Barriers to D/C:            Co-evaluation              AM-PAC PT "6 Clicks" Daily Activity     Outcome Measure Help from another person eating meals?: None Help from another person taking care of personal grooming?: A Lot Help from another person toileting, which includes using toliet, bedpan, or urinal?: A Little Help from another person bathing (including washing, rinsing, drying)?: A Lot Help from another person to put on and taking off regular upper body clothing?: A Lot Help from another person to put on and taking off regular lower body clothing?: A Little 6 Click Score: 16   End of Session Equipment Utilized During Treatment: Gait belt;Other (comment) (Sling) Nurse Communication: Mobility status  Activity Tolerance: Patient tolerated treatment well Patient left: in chair;with call  bell/phone within reach  OT Visit Diagnosis: Unsteadiness on feet (R26.81);Muscle weakness (generalized) (M62.81);Pain Pain - Right/Left: Right Pain - part of body: Arm                Time: 1610-9604 OT Time Calculation (min): 25 min Charges:  OT General Charges $OT Visit: 1 Visit OT Evaluation $OT Eval Moderate Complexity: 1 Mod OT Treatments $Self Care/Home Management : 8-22 mins G-Codes:     Cammy Copa, OTS [phone]   Cammy Copa 05/20/2017, 3:24 PM

## 2017-05-20 NOTE — Op Note (Signed)
Patient name: Daniel Stewart MRN: [id number] DOB: 31-May-1966 Sex: male  05/19/2017 Pre-operative Diagnosis: Right brachial artery injury Post-operative diagnosis:  Same Surgeon:  Durene Cal Assistants:  Vena Austria Procedure:   #1 Repair of right brachial artery injury with interposition right greater saphenous vein graft   #2: Thrombectomy of right brachial artery Anesthesia:  Gen.  Blood Loss:  See anesthesia record Specimens:  None  Findings:  The brachial artery was completely transected.  I could not primarily repair the artery, as of the distance between the 2 ends.  I resected approximately 1.5 cm off both ends of the transected brachial artery.  A non-reversed right greater saphenous vein interposition graft was placed.  I visualized the median nerve throughout the incision.  It appeared to be intact.   Indications:  The patient presented to the emergency department following traumatic injury to his right arm.  He had a posterior dislocation of his elbow.  He did not have any Doppler signals in his hand when he presented, however after reduction of the elbow he regained a right radial artery Doppler signal.  He was sent for CT scan which showed a brachial artery injury.  I discussed proceeding with exploration of the brachial artery and repair.  After reduction of the elbow, there was no evidence of ischemia to the hand.  However, with the CT scan findings, I felt exploration was aborted.   Procedure:  The patient was identified in the holding area and taken to [facility] OR ROOM 03  The patient was then placed supine on the table. general anesthesia was administered.  The patient was prepped and draped in the usual sterile fashion.  A time out was called and antibiotics were administered.   A medial incision was made just proximal to the elbow after I had located the brachial artery with ultrasound.  Cautery was used to dissect through the subcutaneous tissue.  I identified the median  nerve, however had difficulty locating the brachial artery I then extended the incision proximal and with the assistance of hand-held Doppler, I was able to identify the brachial artery.  It had been completely transected and retracted.  I tried to identify the distal into the same incision however this was not possible and so at therefore made a second incision distal to the antecubital crease.  I was able to identify the distal end of the brachial artery.  It was mobilized to the bifurcation.  I was able to visualize the median nerve through these incisions and it appeared to be intact.  I did not feel like after removing the injured artery that there would be enough length to perform a primary anastomosis.  I then elected to harvest the right greater saphenous vein.  This was done through a longitudinal incision in the right groin.  The vein appeared healthy approximate 4 mm.  The proximal and distal ends of the vein were  ligated with 2-0 silk ties.  The vein was prepared on the back table.  It distended nicely.  I did not give any heparin.  I performed a thrombectomy of the right brachial artery with a #3 Fogarty and did remove some clot and established good antegrade bleeding from the brachial artery.  I resected across a 1.5 cm of the artery.  The vein was placed in a non-reversed fashion.  A running anastomosis was created with 6-0 Prolene.  Prior to completion the clamps were released.  Then used a Mills valvulotome to lyse the  valves.  There was excellent pulsatile flow.  I marked the vein with a pen the vein was then tunneled under the skin.  I evaluated the distal artery and resected approximately 1.5 cm.  I used #3 Fogarty to perform thrombectomy.  I did remove some clot and had good backbleeding.  The vein was then cut to the appropriate length and a running anastomosis was created with 6-0 Prolene.  Prior to completion the appropriate flushing maneuvers were performed.  I passed the Fogarty proximally  to make sure this was without complication and thrombus.  The anastomosis was completed.  Patient had excellent pulse in the graft.  At a brisk radial and ulnar artery signal at the wrist.  At this point, Dr. Jena Gauss came in for the orthopedic portion of the procedure  Disposition: To PACU stable   V. Durene Cal, M.D. [facility] [place] Office: [phone] Pager:  [phone]

## 2017-05-21 ENCOUNTER — Encounter (HOSPITAL_COMMUNITY): Payer: Self-pay | Admitting: Student

## 2017-05-21 MED ORDER — OXYCODONE HCL 5 MG PO TABS
5.0000 mg | ORAL_TABLET | ORAL | Status: DC | PRN
  Administered 2017-05-21: 15 mg via ORAL
  Administered 2017-05-22: 5 mg via ORAL
  Administered 2017-05-22: 10 mg via ORAL
  Administered 2017-05-23 (×2): 5 mg via ORAL
  Administered 2017-05-23: 10 mg via ORAL
  Administered 2017-05-23 – 2017-05-24 (×5): 5 mg via ORAL
  Administered 2017-05-24 – 2017-05-25 (×5): 10 mg via ORAL
  Filled 2017-05-21 (×3): qty 2
  Filled 2017-05-21 (×3): qty 1
  Filled 2017-05-21: qty 3
  Filled 2017-05-21: qty 1
  Filled 2017-05-21 (×2): qty 2
  Filled 2017-05-21: qty 1
  Filled 2017-05-21: qty 2
  Filled 2017-05-21: qty 3
  Filled 2017-05-21 (×3): qty 1
  Filled 2017-05-21: qty 2

## 2017-05-21 MED ORDER — PANTOPRAZOLE SODIUM 40 MG PO TBEC
80.0000 mg | DELAYED_RELEASE_TABLET | Freq: Two times a day (BID) | ORAL | Status: DC
  Administered 2017-05-21 – 2017-05-25 (×8): 80 mg via ORAL
  Filled 2017-05-21 (×9): qty 2

## 2017-05-21 MED ORDER — CHLORPROMAZINE HCL 50 MG PO TABS
50.0000 mg | ORAL_TABLET | Freq: Four times a day (QID) | ORAL | Status: DC
  Administered 2017-05-21 – 2017-05-24 (×11): 50 mg via ORAL
  Filled 2017-05-21 (×20): qty 1

## 2017-05-21 MED ORDER — SODIUM CHLORIDE 0.9 % IV SOLN
12.5000 mg | Freq: Once | INTRAVENOUS | Status: DC
  Filled 2017-05-21: qty 0.5

## 2017-05-21 MED ORDER — MORPHINE SULFATE (PF) 4 MG/ML IV SOLN
2.0000 mg | INTRAVENOUS | Status: DC | PRN
  Administered 2017-05-21 – 2017-05-22 (×2): 2 mg via INTRAVENOUS
  Filled 2017-05-21 (×2): qty 1

## 2017-05-21 NOTE — Anesthesia Preprocedure Evaluation (Addendum)
Anesthesia Evaluation  Patient identified by MRN, date of birth, ID band Patient awake    Reviewed: Allergy & Precautions, NPO status , Patient's Chart, lab work & pertinent test results  Airway Mallampati: II  TM Distance: >3 FB Neck ROM: Full    Dental no notable dental hx.    Pulmonary neg pulmonary ROS, former smoker,    Pulmonary exam normal breath sounds clear to auscultation       Cardiovascular negative cardio ROS Normal cardiovascular exam Rhythm:Regular Rate:Normal     Neuro/Psych negative neurological ROS  negative psych ROS   GI/Hepatic negative GI ROS, Neg liver ROS,   Endo/Other  negative endocrine ROS  Renal/GU negative Renal ROS  negative genitourinary   Musculoskeletal negative musculoskeletal ROS (+)   Abdominal   Peds negative pediatric ROS (+)  Hematology negative hematology ROS (+)   Anesthesia Other Findings   Reproductive/Obstetrics negative OB ROS                             Lab Results  Component Value Date   WBC 13.7 (H) 05/22/2017   HGB 7.1 (L) 05/22/2017   HCT 22.5 (L) 05/22/2017   MCV 81.5 05/22/2017   PLT 210 05/22/2017   Lab Results  Component Value Date   CREATININE 0.90 05/19/2017   BUN 25 (H) 05/19/2017   NA 141 05/19/2017   K 3.9 05/19/2017   CL 107 05/19/2017   CO2 26 10/23/2014    Anesthesia Physical  Anesthesia Plan  ASA: II  Anesthesia Plan: General   Post-op Pain Management:    Induction: Intravenous  PONV Risk Score and Plan: 2 and Ondansetron, Dexamethasone and Treatment may vary due to age or medical condition  Airway Management Planned: LMA  Additional Equipment:   Intra-op Plan:   Post-operative Plan: Extubation in OR  Informed Consent: I have reviewed the patients History and Physical, chart, labs and discussed the procedure including the risks, benefits and alternatives for the proposed anesthesia with the  patient or authorized representative who has indicated his/her understanding and acceptance.   Dental advisory given  Plan Discussed with: CRNA  Anesthesia Plan Comments:        Anesthesia Quick Evaluation

## 2017-05-21 NOTE — Progress Notes (Addendum)
[facility] [place]  Subjective  - Hiccups all day.  Other wise no new complaints.   Objective 119/63 (!) 107 99.3 F (37.4 C) (Oral) 20 97%  Intake/Output Summary (Last 24 hours) at 05/21/17 0916 Last data filed at 05/21/17 0600  Gross per 24 hour  Intake             1240 ml  Output             1000 ml  Net              240 ml    Doppler palmer signal biphasic Hand warm with active range of motion of finger and sensation intact Right LE incision healing well  Assessment/Planning: POD # 2 Procedure:   #1 Repair of right brachial artery injury with interposition right greater saphenous vein graft                         #2: Thrombectomy of right brachial artery 81 mg Aspirin daily   COLLINS, EMMA MAUREEN 05/21/2017 9:16 AM --  Laboratory Lab Results:  Recent Labs  05/19/17 0940 05/19/17 1003 05/20/17 0756  WBC 15.2*  --  15.6*  HGB 10.7* 11.9* 8.8*  HCT 33.0* 35.0* 27.2*  PLT 239  --  221   BMET  Recent Labs  05/19/17 0940 05/19/17 1003  NA  --  141  K  --  3.9  CL  --  107  GLUCOSE  --  130*  BUN  --  25*  CREATININE 1.02 0.90    COAG No results found for: INR, PROTIME No results found for: PTT  Palpable right radial pulse  Wells Synda Bagent

## 2017-05-21 NOTE — Progress Notes (Signed)
Patient ID: Daniel Stewart, male   DOB: 1966/03/22, 51 y.o.   MRN: [id number]   LOS: 2 days   Subjective: Doing well, just sore. Has had hiccoughs ever since OR.   Objective: Vital signs in last 24 hours: Temp:  [97.7 F (36.5 C)-99.7 F (37.6 C)] 99.3 F (37.4 C) (09/21 0500) Pulse Rate:  [105-115] 107 (09/21 0500) Resp:  [18-20] 20 (09/21 0500) BP: (119-154)/(63-79) 119/63 (09/21 0500) SpO2:  [97 %-100 %] 97 % (09/21 0500) Last BM Date: 05/19/17   Physical Exam General appearance: alert and no distress  RUE: Dressing taken down, bloody discharge posterior and superior but no obvious source at this point. VAC dressing in place with good seal. No noted erythema. Good fxn hand. Dressing replaced.   Assessment/Plan: RUE crush injury s/p ex fix, I&D, fasciotomies -- Return to OR in AM, NPO after MN. Give oral pain meds, SL IV. Hiccoughs -- Increased and scheduled thorazine, added high dose Protonix. Can wean once hiccoughs cease. ABL anemia -- Check CBC in am prior to OR    Freeman Caldron, PA-C Orthopedic Surgery [phone] 05/21/2017

## 2017-05-21 NOTE — Care Management (Signed)
Case manager received call from patient's assigned worker's comp Nurse CM with [address] [place], [phone], fax:[phone].  She is available for any discharge needs.

## 2017-05-22 ENCOUNTER — Inpatient Hospital Stay (HOSPITAL_COMMUNITY): Payer: Worker's Compensation | Admitting: Anesthesiology

## 2017-05-22 ENCOUNTER — Encounter (HOSPITAL_COMMUNITY)
Admission: EM | Disposition: A | Discharge status: Home / Self Care | Payer: Self-pay | Source: Home / Self Care | Attending: Student

## 2017-05-22 HISTORY — PX: SECONDARY CLOSURE OF WOUND: SHX6208

## 2017-05-22 LAB — PREPARE RBC (CROSSMATCH)

## 2017-05-22 LAB — CBC
HCT: 22.5 % — ABNORMAL LOW (ref 39.0–52.0)
HCT: 20.8 % — ABNORMAL LOW (ref 39.0–52.0)
HEMOGLOBIN: 7.1 g/dL — AB (ref 13.0–17.0)
Hemoglobin: 6.7 g/dL — CL (ref 13.0–17.0)
MCH: 25.7 pg — AB (ref 26.0–34.0)
MCH: 26.4 pg (ref 26.0–34.0)
MCHC: 31.6 g/dL (ref 30.0–36.0)
MCHC: 32.2 g/dL (ref 30.0–36.0)
MCV: 81.5 fL (ref 78.0–100.0)
MCV: 81.9 fL (ref 78.0–100.0)
PLATELETS: 210 10*3/uL (ref 150–400)
PLATELETS: 199 10*3/uL (ref 150–400)
RBC: 2.76 MIL/uL — AB (ref 4.22–5.81)
RBC: 2.54 MIL/uL — ABNORMAL LOW (ref 4.22–5.81)
RDW: 13 % (ref 11.5–15.5)
RDW: 13.1 % (ref 11.5–15.5)
WBC: 13.7 10*3/uL — ABNORMAL HIGH (ref 4.0–10.5)
WBC: 11.5 10*3/uL — ABNORMAL HIGH (ref 4.0–10.5)

## 2017-05-22 LAB — SURGICAL PCR SCREEN
MRSA, PCR: NEGATIVE
STAPHYLOCOCCUS AUREUS: POSITIVE — AB

## 2017-05-22 LAB — ABO/RH: ABO/RH(D): A POS

## 2017-05-22 SURGERY — SECONDARY CLOSURE OF WOUND
Anesthesia: General | Site: Arm Lower | Laterality: Right

## 2017-05-22 MED ORDER — DEXAMETHASONE SODIUM PHOSPHATE 10 MG/ML IJ SOLN
INTRAMUSCULAR | Status: AC
  Filled 2017-05-22: qty 1

## 2017-05-22 MED ORDER — PROPOFOL 10 MG/ML IV BOLUS
INTRAVENOUS | Status: DC | PRN
  Administered 2017-05-22: 100 mg via INTRAVENOUS
  Administered 2017-05-22 (×2): 50 mg via INTRAVENOUS

## 2017-05-22 MED ORDER — ALBUMIN HUMAN 5 % IV SOLN
INTRAVENOUS | Status: DC | PRN
  Administered 2017-05-22 (×2): via INTRAVENOUS

## 2017-05-22 MED ORDER — ONDANSETRON HCL 4 MG/2ML IJ SOLN
INTRAMUSCULAR | Status: DC | PRN
  Administered 2017-05-22: 4 mg via INTRAVENOUS

## 2017-05-22 MED ORDER — FENTANYL CITRATE (PF) 100 MCG/2ML IJ SOLN
INTRAMUSCULAR | Status: DC | PRN
  Administered 2017-05-22: 50 ug via INTRAVENOUS

## 2017-05-22 MED ORDER — ONDANSETRON HCL 4 MG/2ML IJ SOLN
INTRAMUSCULAR | Status: AC
  Filled 2017-05-22: qty 2

## 2017-05-22 MED ORDER — CEFAZOLIN SODIUM-DEXTROSE 2-4 GM/100ML-% IV SOLN
INTRAVENOUS | Status: AC
  Filled 2017-05-22: qty 100

## 2017-05-22 MED ORDER — FENTANYL CITRATE (PF) 250 MCG/5ML IJ SOLN
INTRAMUSCULAR | Status: AC
Start: 2017-05-22 — End: 2017-05-22
  Filled 2017-05-22: qty 5

## 2017-05-22 MED ORDER — LACTATED RINGERS IV SOLN
INTRAVENOUS | Status: DC | PRN
  Administered 2017-05-22 (×2): via INTRAVENOUS

## 2017-05-22 MED ORDER — CHLORHEXIDINE GLUCONATE 4 % EX LIQD: 60.0000 mL | Freq: Once | CUTANEOUS | Status: DC

## 2017-05-22 MED ORDER — CALCIUM CHLORIDE 10 % IV SOLN
INTRAVENOUS | Status: DC | PRN
  Administered 2017-05-22: 200 mg via INTRAVENOUS
  Administered 2017-05-22: 100 mg via INTRAVENOUS

## 2017-05-22 MED ORDER — ESMOLOL HCL 100 MG/10ML IV SOLN
INTRAVENOUS | Status: DC | PRN
  Administered 2017-05-22: 20 mg via INTRAVENOUS

## 2017-05-22 MED ORDER — EPHEDRINE SULFATE 50 MG/ML IJ SOLN
INTRAMUSCULAR | Status: DC | PRN
  Administered 2017-05-22: 15 mg via INTRAVENOUS

## 2017-05-22 MED ORDER — DEXAMETHASONE SODIUM PHOSPHATE 10 MG/ML IJ SOLN
INTRAMUSCULAR | Status: DC | PRN
  Administered 2017-05-22: 10 mg via INTRAVENOUS

## 2017-05-22 MED ORDER — METOCLOPRAMIDE HCL 5 MG/ML IJ SOLN: 10.0000 mg | Freq: Once | INTRAMUSCULAR | Status: DC | PRN

## 2017-05-22 MED ORDER — PHENYLEPHRINE HCL 10 MG/ML IJ SOLN
INTRAMUSCULAR | Status: DC | PRN
  Administered 2017-05-22: 120 ug via INTRAVENOUS
  Administered 2017-05-22: 200 ug via INTRAVENOUS
  Administered 2017-05-22: 240 ug via INTRAVENOUS

## 2017-05-22 MED ORDER — 0.9 % SODIUM CHLORIDE (POUR BTL) OPTIME
TOPICAL | Status: DC | PRN
  Administered 2017-05-22: 1000 mL

## 2017-05-22 MED ORDER — CEFAZOLIN SODIUM-DEXTROSE 2-4 GM/100ML-% IV SOLN
2.0000 g | INTRAVENOUS | Status: AC
  Administered 2017-05-22: 2 g via INTRAVENOUS

## 2017-05-22 MED ORDER — MIDAZOLAM HCL 2 MG/2ML IJ SOLN
INTRAMUSCULAR | Status: AC
  Filled 2017-05-22: qty 2

## 2017-05-22 MED ORDER — FENTANYL CITRATE (PF) 100 MCG/2ML IJ SOLN
INTRAMUSCULAR | Status: AC
  Administered 2017-05-22: 25 ug via INTRAVENOUS
  Filled 2017-05-22: qty 2

## 2017-05-22 MED ORDER — SODIUM CHLORIDE 0.9 % IV SOLN: 10.0000 mL/h | Freq: Once | INTRAVENOUS | Status: DC

## 2017-05-22 MED ORDER — PROPOFOL 10 MG/ML IV BOLUS
INTRAVENOUS | Status: AC
  Filled 2017-05-22: qty 20

## 2017-05-22 MED ORDER — ROCURONIUM BROMIDE 10 MG/ML (PF) SYRINGE
PREFILLED_SYRINGE | INTRAVENOUS | Status: AC
  Filled 2017-05-22: qty 5

## 2017-05-22 MED ORDER — FENTANYL CITRATE (PF) 100 MCG/2ML IJ SOLN
25.0000 ug | INTRAMUSCULAR | Status: DC | PRN
  Administered 2017-05-22 (×2): 25 ug via INTRAVENOUS

## 2017-05-22 MED ORDER — ESMOLOL HCL 100 MG/10ML IV SOLN
INTRAVENOUS | Status: AC
  Filled 2017-05-22: qty 10

## 2017-05-22 MED ORDER — LIDOCAINE HCL (CARDIAC) 20 MG/ML IV SOLN
INTRAVENOUS | Status: DC | PRN
  Administered 2017-05-22: 60 mg via INTRAVENOUS

## 2017-05-22 MED ORDER — LIDOCAINE 2% (20 MG/ML) 5 ML SYRINGE
INTRAMUSCULAR | Status: AC
  Filled 2017-05-22: qty 5

## 2017-05-22 MED ORDER — PHENYLEPHRINE HCL 10 MG/ML IJ SOLN
INTRAMUSCULAR | Status: DC | PRN
  Administered 2017-05-22: 100 ug/min via INTRAVENOUS

## 2017-05-22 SURGICAL SUPPLY — 48 items
BANDAGE ACE 3X5.8 VEL STRL LF (GAUZE/BANDAGES/DRESSINGS) ×4 IMPLANT
BANDAGE ACE 4X5 VEL STRL LF (GAUZE/BANDAGES/DRESSINGS) ×4 IMPLANT
BLADE SURG 10 STRL SS (BLADE) IMPLANT
BNDG COHESIVE 4X5 TAN STRL (GAUZE/BANDAGES/DRESSINGS) IMPLANT
BNDG GAUZE ELAST 4 BULKY (GAUZE/BANDAGES/DRESSINGS) ×4 IMPLANT
BNDG GAUZE STRTCH 6 (GAUZE/BANDAGES/DRESSINGS) ×12 IMPLANT
BRUSH SCRUB SURG 4.25 DISP (MISCELLANEOUS) ×4 IMPLANT
CANISTER WOUND CARE 500ML ATS (WOUND CARE) ×4 IMPLANT
CHLORAPREP W/TINT 26ML (MISCELLANEOUS) IMPLANT
COVER SURGICAL LIGHT HANDLE (MISCELLANEOUS) ×4 IMPLANT
DRAPE C-ARMOR (DRAPES) IMPLANT
DRAPE U-SHAPE 47X51 STRL (DRAPES) IMPLANT
DRSG ADAPTIC 3X8 NADH LF (GAUZE/BANDAGES/DRESSINGS) ×4 IMPLANT
DRSG MEPITEL 4X7.2 (GAUZE/BANDAGES/DRESSINGS) ×4 IMPLANT
DRSG VAC ATS MED SENSATRAC (GAUZE/BANDAGES/DRESSINGS) ×4 IMPLANT
ELECT CAUTERY BLADE 6.4 (BLADE) IMPLANT
ELECT REM PT RETURN 9FT ADLT (ELECTROSURGICAL) ×4
ELECTRODE REM PT RTRN 9FT ADLT (ELECTROSURGICAL) ×2 IMPLANT
GAUZE SPONGE 4X4 12PLY STRL (GAUZE/BANDAGES/DRESSINGS) IMPLANT
GLOVE BIO SURGEON STRL SZ7.5 (GLOVE) ×8 IMPLANT
GLOVE BIOGEL PI IND STRL 7.5 (GLOVE) ×2 IMPLANT
GLOVE BIOGEL PI INDICATOR 7.5 (GLOVE) ×2
GOWN STRL REUS W/ TWL LRG LVL3 (GOWN DISPOSABLE) ×4 IMPLANT
GOWN STRL REUS W/TWL LRG LVL3 (GOWN DISPOSABLE) ×4
HANDPIECE INTERPULSE COAX TIP (DISPOSABLE)
KIT BASIN OR (CUSTOM PROCEDURE TRAY) ×4 IMPLANT
KIT ROOM TURNOVER OR (KITS) ×4 IMPLANT
MANIFOLD NEPTUNE II (INSTRUMENTS) ×4 IMPLANT
NS IRRIG 1000ML POUR BTL (IV SOLUTION) ×4 IMPLANT
PACK ORTHO EXTREMITY (CUSTOM PROCEDURE TRAY) ×4 IMPLANT
PAD ARMBOARD 7.5X6 YLW CONV (MISCELLANEOUS) ×8 IMPLANT
PADDING CAST COTTON 6X4 STRL (CAST SUPPLIES) ×4 IMPLANT
SET HNDPC FAN SPRY TIP SCT (DISPOSABLE) IMPLANT
SPONGE LAP 18X18 X RAY DECT (DISPOSABLE) IMPLANT
STOCKINETTE IMPERVIOUS 9X36 MD (GAUZE/BANDAGES/DRESSINGS) IMPLANT
SUT ETHILON 2 0 FS 18 (SUTURE) ×12 IMPLANT
SUT ETHILON 3 0 PS 1 (SUTURE) ×4 IMPLANT
SUT PDS AB 2-0 CT1 27 (SUTURE) IMPLANT
SUT VIC AB 2-0 CT1 27 (SUTURE) ×6
SUT VIC AB 2-0 CT1 TAPERPNT 27 (SUTURE) ×6 IMPLANT
SWAB CULTURE ESWAB REG 1ML (MISCELLANEOUS) IMPLANT
TOWEL OR 17X24 6PK STRL BLUE (TOWEL DISPOSABLE) ×4 IMPLANT
TOWEL OR 17X26 10 PK STRL BLUE (TOWEL DISPOSABLE) ×4 IMPLANT
TUBE CONNECTING 12'X1/4 (SUCTIONS) ×1
TUBE CONNECTING 12X1/4 (SUCTIONS) ×3 IMPLANT
UNDERPAD 30X30 (UNDERPADS AND DIAPERS) ×4 IMPLANT
WATER STERILE IRR 1000ML POUR (IV SOLUTION) ×4 IMPLANT
YANKAUER SUCT BULB TIP NO VENT (SUCTIONS) ×4 IMPLANT

## 2017-05-22 NOTE — Transfer of Care (Signed)
Immediate Anesthesia Transfer of Care Note  Patient: Linda S Mckey  Procedure(s) Performed: Procedure(s): SECONDARY CLOSURE OF WOUND (Right)  Patient Location: PACU  Anesthesia Type:General  Level of Consciousness: awake and patient cooperative  Airway & Oxygen Therapy: Patient Spontanous Breathing  Post-op Assessment: Report given to RN and Post -op Vital signs reviewed and stable  Post vital signs: Reviewed and stable  Last Vitals:  Vitals:   05/22/17 0447 05/22/17 0849  BP: (!) 98/53 (!) 153/87  Pulse: (!) 147 (!) 149  Resp:  12  Temp: 37.3 C   SpO2: 95% 95%    Last Pain:  Vitals:   05/22/17 0447  TempSrc: Oral  PainSc:       Patients Stated Pain Goal: 3 (05/22/17 0228)  Complications: No apparent anesthesia complications

## 2017-05-22 NOTE — Interval H&P Note (Signed)
History and Physical Interval Note:  05/22/2017 6:59 AM  Daniel Stewart  has presented today for surgery, with the diagnosis of Open elbow dislocation  The various methods of treatment have been discussed with the patient and family. After consideration of risks, benefits and other options for treatment, the patient has consented to  Procedure(s): SECONDARY CLOSURE OF WOUND (Right) EXTERNAL FIXATION ARM (Right) as a surgical intervention .  The patient's history has been reviewed, patient examined, no change in status, stable for surgery.  I have reviewed the patient's chart and labs.  Questions were answered to the patient's satisfaction.     Jakyah Bradby, Gillie Manners

## 2017-05-22 NOTE — Anesthesia Postprocedure Evaluation (Signed)
Anesthesia Post Note  Patient: Daniel Stewart  Procedure(s) Performed: Procedure(s) (LRB): SECONDARY CLOSURE OF WOUND (Right)     Patient location during evaluation: PACU Anesthesia Type: General Level of consciousness: awake and alert Pain management: pain level controlled Vital Signs Assessment: post-procedure vital signs reviewed and stable Respiratory status: spontaneous breathing, nonlabored ventilation, respiratory function stable and patient connected to nasal cannula oxygen Cardiovascular status: blood pressure returned to baseline and stable Postop Assessment: no apparent nausea or vomiting Anesthetic complications: no    Last Vitals:  Vitals:   05/22/17 1245 05/22/17 1251  BP: (!) 150/67 (!) 145/74  Pulse: (!) 130 84  Resp: 17 14  Temp: 37.2 C 37 C  SpO2: 99% 97%    Last Pain:  Vitals:   05/22/17 1251  TempSrc: Oral  PainSc:                  Kennieth Rad

## 2017-05-22 NOTE — Progress Notes (Signed)
Patient to OR

## 2017-05-22 NOTE — Anesthesia Procedure Notes (Signed)
Procedure Name: LMA Insertion Date/Time: 05/22/2017 7:38 AM Performed by: Rosiland Oz Pre-anesthesia Checklist: Emergency Drugs available, Patient identified, Suction available, Patient being monitored and Timeout performed Patient Re-evaluated:Patient Re-evaluated prior to induction Oxygen Delivery Method: Circle system utilized Preoxygenation: Pre-oxygenation with 100% oxygen Induction Type: IV induction LMA: LMA inserted LMA Size: 4.0 Number of attempts: 2 Placement Confirmation: positive ETCO2 and breath sounds checked- equal and bilateral Tube secured with: Tape Dental Injury: Teeth and Oropharynx as per pre-operative assessment

## 2017-05-22 NOTE — Op Note (Signed)
OrthopaedicSurgeryOperativeNote (ZOX:[id number]) Date of Surgery: 05/22/2017  Admit Date: 05/19/2017   Diagnoses: Pre-Op Diagnoses: Right open elbow dislocation s/p vascular bypass repair and fasciotomy  Post-Op Diagnosis: Same  Procedures: 1. CPT 13160-Secondary closure of right upper extremity fasciotomy wound 2. CPT 15852-Incisional wound vac placement   Surgeons: Primary: Roby Lofts, MD   Location:[facility] OR ROOM 03   AnesthesiaGeneral   Antibiotics:Ancef 2g preop  Tourniquettime:None.  EstimatedBloodLoss:Minimal  Complications:None  Specimens:None  Implants: None  IndicationsforSurgery: This is a 51 year old male who had his arm caught in a machine press and had an open elbow dislocation and brachial artery injury. He underwent vascular bypass and I&D, LUCL/MCL repair and external fixation on 05/19/17. He had prophylactic fasciotomies of his volar forearm at this visit. He had a wound vac placed and the plan was to return for irrigation and closure of the wound. Risks and benefits were extensively discussed with his wife which included risk of bleeding, infection, inability to close wounds, nerve and blood vessel injury and need for revision surgery. He and his wife consented to surgery  Operative Findings: 1. Healthy volar musculature of forearm without any signs of devitalized tissue 2. Successful secondary closure of fasciotomy wound with incisional wound vac placement.  Procedure: The patient was identified in the preoperative holding area. Consent was confirmed with the patient and their family and all questions were answered. The operative extremity was marked after confirmation with the patient. he was then brought back to the operating room by our anesthesia colleagues. He was placed under general anesthesia after being transferred to a regular OR table. His previous dressing was taken down. His right arm was then prepped and draped in usual sterile  fashion. A timeout was performed to verify the patient, the procedure and the extremity. Preoperative antibiotics were dosed.  The staples and vessel loop that was in place was removed and the wound was irrigated. The muscle was all viable and healthy. I then performed a layered closure with 2-0 vicryl and 2-0 nylon suture. The wound came together without difficulty. There was some blistering of the antecubital fossa skin. This was covered with mepitel and an incisional wound vac was placed over the volar fasciotomy wound. This was connected to . The arm was then dressed with kerlix and an ACE wrap. The pin sites were dressed with kerlex. He was awoken from anesthesia and taken to PACU in stable condition.  Post Op Plan/Instructions: The patient will remain nonweightbearing to the right upper extremity. We will take down his incisional vac either Monday or Tuesday. He will need aspirin and lovenox for DVT prophylaxis.  I was present and performed the entire surgery.  Truitt Merle, MD [facility]

## 2017-05-23 ENCOUNTER — Encounter (HOSPITAL_COMMUNITY): Payer: Self-pay | Admitting: Student

## 2017-05-23 LAB — CBC
HEMATOCRIT: 21.5 % — AB (ref 39.0–52.0)
HEMOGLOBIN: 7 g/dL — AB (ref 13.0–17.0)
MCH: 26.1 pg (ref 26.0–34.0)
MCHC: 32.6 g/dL (ref 30.0–36.0)
MCV: 80.2 fL (ref 78.0–100.0)
Platelets: 284 10*3/uL (ref 150–400)
RBC: 2.68 MIL/uL — AB (ref 4.22–5.81)
RDW: 13.8 % (ref 11.5–15.5)
WBC: 12.8 10*3/uL — AB (ref 4.0–10.5)

## 2017-05-23 MED ORDER — MAGNESIUM CITRATE PO SOLN: 0.5000 | Freq: Once | ORAL | Status: DC | PRN

## 2017-05-23 MED ORDER — POLYETHYLENE GLYCOL 3350 17 G PO PACK
17.0000 g | PACK | Freq: Every day | ORAL | Status: DC
  Administered 2017-05-23 – 2017-05-25 (×3): 17 g via ORAL
  Filled 2017-05-23 (×3): qty 1

## 2017-05-23 MED ORDER — DOCUSATE SODIUM 100 MG PO CAPS
100.0000 mg | ORAL_CAPSULE | Freq: Two times a day (BID) | ORAL | Status: DC
  Administered 2017-05-23 – 2017-05-25 (×5): 100 mg via ORAL
  Filled 2017-05-23 (×5): qty 1

## 2017-05-23 NOTE — Progress Notes (Signed)
    Subjective  - POD #4  Says his arm hurts.   Physical Exam:  Palpable right radial pulse.     Assessment/Plan:  POD #4  The patient has a palpable radial pulse status post repair of his brachial artery with interposition graft.  He is stable for discharge from my perspective.  I have recommended 81 mg aspirin.  He will follow with me in the office in 2-3 weeks.  We discussed the need for ongoing surveillance of his bypass graft.  Durene Cal 05/23/2017 12:24 PM --  Vitals:   05/22/17 2345 05/23/17 0410  BP: 116/66 127/74  Pulse: (!) 108 (!) 111  Resp:    Temp: 97.8 F (36.6 C) 98.6 F (37 C)  SpO2: 95% 96%    Intake/Output Summary (Last 24 hours) at 05/23/17 1224 Last data filed at 05/23/17 0410  Gross per 24 hour  Intake                0 ml  Output              600 ml  Net             -600 ml     Laboratory CBC    Component Value Date/Time   WBC 11.5 (H) 05/22/2017 0930   HGB 6.7 (LL) 05/22/2017 0930   HCT 20.8 (L) 05/22/2017 0930   PLT 199 05/22/2017 0930    BMET    Component Value Date/Time   NA 141 05/19/2017 1003   K 3.9 05/19/2017 1003   CL 107 05/19/2017 1003   CO2 26 10/23/2014 0957   GLUCOSE 130 (H) 05/19/2017 1003   BUN 25 (H) 05/19/2017 1003   CREATININE 0.90 05/19/2017 1003   CREATININE 0.88 10/23/2014 0957   CALCIUM 9.9 10/23/2014 0957   GFRNONAA >60 05/19/2017 0940   GFRAA >60 05/19/2017 0940    COAG No results found for: INR, PROTIME No results found for: PTT  Antibiotics Anti-infectives    Start     Dose/Rate Route Frequency Ordered Stop   05/22/17 0702  ceFAZolin (ANCEF) 2-4 GM/100ML-% IVPB    Comments:  Rosiland Oz   : cabinet override      05/22/17 0702 05/22/17 0750   05/22/17 0700  ceFAZolin (ANCEF) IVPB 2g/100 mL premix     2 g 200 mL/hr over 30 Minutes Intravenous On call to O.R. 05/22/17 0700 05/22/17 0750   05/20/17 0800  ceFAZolin (ANCEF) IVPB 2g/100 mL premix     2 g 200 mL/hr over 30 Minutes  Intravenous Every 6 hours 05/20/17 0717 05/20/17 2018   05/20/17 0717  ceFAZolin (ANCEF) IVPB 2g/100 mL premix  Status:  Discontinued     2 g 200 mL/hr over 30 Minutes Intravenous On call to O.R. 05/20/17 0717 05/20/17 0746   05/19/17 0800  ceFAZolin (ANCEF) IVPB 2g/100 mL premix     2 g 200 mL/hr over 30 Minutes Intravenous  Once 05/19/17 0747 05/19/17 1315       V. Charlena Cross, M.D. [facility] Office: [phone] Pager:  [phone]

## 2017-05-23 NOTE — Progress Notes (Signed)
Occupational Therapy Treatment Patient Details Name: ANTWION CARPENTER MRN: [id number] DOB: 11-02-1965 Today's Date: 05/23/2017    History of present illness Pt is a 51 y.o. male brought to [facility] following crush injury to his right arm at work. Pt with brachial artery injury s/p bypass and LUCL/MCL repair with external fixations of elbow with prophylactic fasciotomies. No PMH on file.    OT comments  Pt. Was cooperative during therapy session. Pt. Was able to perform mobility in room at s level with assist to manage tubing and lines. Pt. Was educated to keep r ue elevated for edema management. Pt. Was educated on retrograde of digits for edema management. Pt. Is requiring assist with dressing tasks secondary to r ue has external fixator. Pt. Was educated to wear loose fitting sleeveless t shirts or cut off sleeve of t-shirt to increase ability with ue dressing tasks.  Follow Up Recommendations  DC plan and follow up therapy as arranged by surgeon    Equipment Recommendations  None recommended by OT    Recommendations for Other Services      Precautions / Restrictions Precautions Required Braces or Orthoses: Sling Restrictions Weight Bearing Restrictions: Yes RUE Weight Bearing: Non weight bearing       Mobility Bed Mobility         Supine to sit: Supervision     General bed mobility comments: Pt. requires s for management of tubes and lines.   Transfers       Sit to Stand: Supervision         General transfer comment: assist for tubes and lines.    Balance                                           ADL either performed or assessed with clinical judgement   ADL       Grooming: Wash/dry hands;Wash/dry face;Minimal assistance           Upper Body Dressing : Moderate assistance;Sitting       Toilet Transfer: Supervision/safety;Ambulation;Comfort height toilet             General ADL Comments: Pt. was educated on performing  adl using one handed technique.     Vision       Perception     Praxis      Cognition Arousal/Alertness: Awake/alert Behavior During Therapy: WFL for tasks assessed/performed Overall Cognitive Status: Within Functional Limits for tasks assessed                                          Exercises     Shoulder Instructions       General Comments      Pertinent Vitals/ Pain       Pain Assessment: 0-10 Pain Score: 5  Pain Location:  (r ue) Pain Descriptors / Indicators: Aching;Burning Pain Intervention(s): Limited activity within patient's tolerance;Premedicated before session  Home Living                                          Prior Functioning/Environment              Frequency  Min 3X/week  Progress Toward Goals  OT Goals(current goals can now be found in the care plan section)  Progress towards OT goals: Progressing toward goals  Acute Rehab OT Goals Patient Stated Goal: go home  Plan Discharge plan remains appropriate    Co-evaluation                 AM-PAC PT "6 Clicks" Daily Activity     Outcome Measure   Help from another person eating meals?: None Help from another person taking care of personal grooming?: A Little Help from another person toileting, which includes using toliet, bedpan, or urinal?: A Little Help from another person bathing (including washing, rinsing, drying)?: A Lot Help from another person to put on and taking off regular upper body clothing?: A Lot Help from another person to put on and taking off regular lower body clothing?: A Little 6 Click Score: 17    End of Session Equipment Utilized During Treatment: Gait belt  OT Visit Diagnosis: Pain Pain - Right/Left: Right Pain - part of body: Arm   Activity Tolerance Patient tolerated treatment well   Patient Left in chair;with call bell/phone within reach   Nurse Communication  (nurse ok therapy)        Time:  1610-9604 OT Time Calculation (min): 40 min  Charges: OT General Charges $OT Visit: 1 Visit OT Treatments $Self Care/Home Management : 8-22 mins $Therapeutic Activity: 8-22 mins $Therapeutic Exercise: 8-22 mins  6 clicks   Teckla Christiansen 05/23/2017, 9:37 AM

## 2017-05-23 NOTE — Progress Notes (Signed)
Orthopaedic Trauma Progress Note  S: Patient was seen this AM. No issues overnight  O:  Vitals:   05/23/17 0410 05/23/17 1424  BP: 127/74 (!) 142/67  Pulse: (!) 111 (!) 130  Resp:  18  Temp: 98.6 F (37 C) 98 F (36.7 C)  SpO2: 96% 96%  RUE: ACE wrap in place, clean dry and intact. Exfix in place with pin sites clean dry and intact. Motor and sensory function in median, ulnar and radial nerve. Warm and well perfused had. Incisional vac in place with good seal   A/P: 51 year old male with right open elbow dislocation with brachial artery injury status post bypass by Dr. Myra Gianotti and LUCL/MCL repair with external fixation of elbow with prophylactic fasciotomies  -NWB -Sling for comfort -Patient has been tacycardic all day, no CBC was checked following his closure and PRBCs yesterday -Will check CBC and transfuse if needed -Possible removal of incisional vac tomorrow and D/C home if hgb stable and wounds are healthy.  Roby Lofts, MD [facility] [phone] (phone)

## 2017-05-23 NOTE — Progress Notes (Signed)
No BM for 3 days. No stool softeners/laxatives ordered.Pt is complaining about constipation.

## 2017-05-24 ENCOUNTER — Telehealth: Payer: Self-pay | Admitting: Surgery

## 2017-05-24 ENCOUNTER — Encounter (HOSPITAL_COMMUNITY): Payer: Self-pay | Admitting: Orthopedic Surgery

## 2017-05-24 DIAGNOSIS — S53104A Unspecified dislocation of right ulnohumeral joint, initial encounter: Secondary | ICD-10-CM

## 2017-05-24 DIAGNOSIS — S45101A Unspecified injury of brachial artery, right side, initial encounter: Secondary | ICD-10-CM

## 2017-05-24 HISTORY — DX: Unspecified injury of brachial artery, right side, initial encounter: S45.101A

## 2017-05-24 HISTORY — DX: Unspecified dislocation of right ulnohumeral joint, initial encounter: S53.104A

## 2017-05-24 LAB — PREPARE RBC (CROSSMATCH)

## 2017-05-24 LAB — CBC
HCT: 23.2 % — ABNORMAL LOW (ref 39.0–52.0)
HCT: 34.8 % — ABNORMAL LOW (ref 39.0–52.0)
Hemoglobin: 7.6 g/dL — ABNORMAL LOW (ref 13.0–17.0)
Hemoglobin: 11.8 g/dL — ABNORMAL LOW (ref 13.0–17.0)
MCH: 26.5 pg (ref 26.0–34.0)
MCH: 28.4 pg (ref 26.0–34.0)
MCHC: 32.8 g/dL (ref 30.0–36.0)
MCHC: 33.9 g/dL (ref 30.0–36.0)
MCV: 80.8 fL (ref 78.0–100.0)
MCV: 83.9 fL (ref 78.0–100.0)
PLATELETS: 309 10*3/uL (ref 150–400)
PLATELETS: 331 10*3/uL (ref 150–400)
RBC: 2.87 MIL/uL — ABNORMAL LOW (ref 4.22–5.81)
RBC: 4.15 MIL/uL — ABNORMAL LOW (ref 4.22–5.81)
RDW: 14 % (ref 11.5–15.5)
RDW: 14.1 % (ref 11.5–15.5)
WBC: 11.5 10*3/uL — ABNORMAL HIGH (ref 4.0–10.5)
WBC: 12.5 10*3/uL — ABNORMAL HIGH (ref 4.0–10.5)

## 2017-05-24 MED ORDER — CHLORHEXIDINE GLUCONATE CLOTH 2 % EX PADS
6.0000 | MEDICATED_PAD | Freq: Every day | CUTANEOUS | Status: DC
  Administered 2017-05-24: 6 via TOPICAL

## 2017-05-24 MED ORDER — SODIUM CHLORIDE 0.9 % IV SOLN
Freq: Once | INTRAVENOUS | Status: AC
  Administered 2017-05-24: 05:00:00 via INTRAVENOUS

## 2017-05-24 MED ORDER — MUPIROCIN 2 % EX OINT
1.0000 "application " | TOPICAL_OINTMENT | Freq: Two times a day (BID) | CUTANEOUS | Status: DC
  Administered 2017-05-24 – 2017-05-25 (×2): 1 via NASAL
  Filled 2017-05-24: qty 22

## 2017-05-24 NOTE — Progress Notes (Signed)
Orthopedic Trauma Service Progress Note   Patient ID: Daniel Stewart MRN: [id number] DOB/AGE: 1966/03/17 51 y.o.  Subjective:  Doing ok this am 1 unit PRBCs has been completed Denies lightheadedness but family states otherwise  Appetite ok  No complaints    Review of Systems  Constitutional: Negative for chills and fever.  Respiratory: Negative for sputum production and shortness of breath.   Cardiovascular: Negative for chest pain and palpitations.  Gastrointestinal: Negative for nausea and vomiting.    Objective:   VITALS:   Vitals:   05/23/17 2237 05/24/17 0457 05/24/17 0532 05/24/17 0900  BP: 114/68 109/63 103/66 (!) 145/82  Pulse: (!) 101 (!) 105 (!) 105 (!) 132  Resp: Temp: 98.7 F (37.1 C) 98 F (36.7 C) 98.7 F (37.1 C) 98.1 F (36.7 C)  TempSrc: Oral Oral Oral Oral  SpO2: 96% 97% 97% 100%  Weight:      Height:        Estimated body mass index is 19.73 kg/m as calculated from the following:   Height as of this encounter:  (1.702 m).   Weight as of this encounter: 57.2 kg (126 lb).   Intake/Output      09/23 0701 - 09/24 0700 09/24 0701 - 09/25 0700   P.O. 240    I.V. (mL/kg) 0.2 (0)    IV Piggyback 0    Total Intake(mL/kg) 240.2 (4.2)    Net +240.2            LABS  Results for orders placed or performed during the hospital encounter of 05/19/17 (from the past 24 hour(s))  CBC     Status: Abnormal   Collection Time: 05/23/17  8:36 PM  Result Value Ref Range   WBC 12.8 (H) 4.0 - 10.5 K/uL   RBC 2.68 (L) 4.22 - 5.81 MIL/uL   Hemoglobin 7.0 (L) 13.0 - 17.0 g/dL   HCT 62.8 (L) 31.5 - 17.6 %   MCV 80.2 78.0 - 100.0 fL   MCH 26.1 26.0 - 34.0 pg   MCHC 32.6 30.0 - 36.0 g/dL   RDW 16.0 73.7 - 10.6 %   Platelets 284 150 - 400 K/uL  Prepare RBC     Status: None   Collection Time: 05/24/17 12:30 AM  Result Value Ref Range   Order Confirmation ORDER PROCESSED BY BLOOD BANK   CBC     Status:  Abnormal   Collection Time: 05/24/17  4:02 AM  Result Value Ref Range   WBC 11.5 (H) 4.0 - 10.5 K/uL   RBC 2.87 (L) 4.22 - 5.81 MIL/uL   Hemoglobin 7.6 (L) 13.0 - 17.0 g/dL   HCT 26.9 (L) 48.5 - 46.2 %   MCV 80.8 78.0 - 100.0 fL   MCH 26.5 26.0 - 34.0 pg   MCHC 32.8 30.0 - 36.0 g/dL   RDW 70.3 50.0 - 93.8 %   Platelets 309 150 - 400 K/uL     PHYSICAL EXAM:    Gen: appears well, NAD, very pleasant  Lungs: clear B  Cardiac: tachy, regular  Abd: + BS, NTND  Ext:       Right Upper Extremity   Ex fix stable  Humerus pinsite dresssing c/d/i  Serous drainage on forearm pinsite dressing   R/U/M sensation intact  R/U/M/AIN/PIN motor intact  Ext warm   + radial pulse   Some pallor noted to palms B   Assessment/Plan: 2 Days Post-Op   Principal Problem:  Traumatic dislocation of right elbow Active Problems:   Compartment syndrome of right upper extremity (HCC)   Injury of right brachial artery   Anti-infectives    Start     Dose/Rate Route Frequency Ordered Stop   05/22/17 0702  ceFAZolin (ANCEF) 2-4 GM/100ML-% IVPB    Comments:  Rosiland Oz   : cabinet override      05/22/17 0702 05/22/17 0750   05/22/17 0700  ceFAZolin (ANCEF) IVPB 2g/100 mL premix     2 g 200 mL/hr over 30 Minutes Intravenous On call to O.R. 05/22/17 0700 05/22/17 0750   05/20/17 0800  ceFAZolin (ANCEF) IVPB 2g/100 mL premix     2 g 200 mL/hr over 30 Minutes Intravenous Every 6 hours 05/20/17 0717 05/20/17 2018   05/20/17 0717  ceFAZolin (ANCEF) IVPB 2g/100 mL premix  Status:  Discontinued     2 g 200 mL/hr over 30 Minutes Intravenous On call to O.R. 05/20/17 0717 05/20/17 0746   05/19/17 0800  ceFAZolin (ANCEF) IVPB 2g/100 mL premix     2 g 200 mL/hr over 30 Minutes Intravenous  Once 05/19/17 0747 05/19/17 1315    .  POD/HD#: 2  51 y/o male s/p traumatic dislocation R elbow with brachial artery injury   - traumatic dislocation R elbow s/p ex fix and LUCL/MCL   NWB  Sling for comfort    Finger and wrist motion   Dressing changes tomorrow including pinsites and removal of incisional vac   Would like PT/OT to ambulate pt as he has not  Ambulated since admission    - Pain management:  Current regimen appears effective  - ABL anemia/Hemodynamics  Pt still tachycardic   Will transfuse 2nd unit  CBC later this afternoon and in am   - DVT/PE prophylaxis:  Ambulate  No pharmacologics  - ID:   periop abx completed  - FEN/GI prophylaxis/Foley/Lines:  Reg diet    - Dispo:  2nd unit of PRBCs  Probable dc home tomorrow  Dressing change tomorrow     Mearl Latin, PA-C [facility] [phone] ([phone] Traci Sermon (C) 05/24/2017, 9:28 AM

## 2017-05-24 NOTE — Telephone Encounter (Signed)
-----   Message from Sharee Pimple, RN sent at 05/23/2017  8:23 PM EDT ----- Regarding: 2-3 weeks postop    ----- Message ----- From: Nada Libman, MD Sent: 05/23/2017  12:25 PM To: [facility] Charge Pool  Schedule patient for follow-up in 2-3 weeks

## 2017-05-24 NOTE — Telephone Encounter (Signed)
Sched appt 06/28/17 at 1:45. Lm on hm#.

## 2017-05-25 ENCOUNTER — Encounter (HOSPITAL_COMMUNITY): Payer: Self-pay | Admitting: Orthopedic Surgery

## 2017-05-25 DIAGNOSIS — D62 Acute posthemorrhagic anemia: Secondary | ICD-10-CM

## 2017-05-25 LAB — TYPE AND SCREEN
ABO/RH(D): A POS
Antibody Screen: NEGATIVE
UNIT DIVISION: 0
UNIT DIVISION: 0
Unit division: 0
Unit division: 0
Unit division: 0

## 2017-05-25 LAB — CBC
HEMATOCRIT: 37.3 % — AB (ref 39.0–52.0)
Hemoglobin: 12.6 g/dL — ABNORMAL LOW (ref 13.0–17.0)
MCH: 28.3 pg (ref 26.0–34.0)
MCHC: 33.8 g/dL (ref 30.0–36.0)
MCV: 83.8 fL (ref 78.0–100.0)
Platelets: 344 10*3/uL (ref 150–400)
RBC: 4.45 MIL/uL (ref 4.22–5.81)
RDW: 14.5 % (ref 11.5–15.5)
WBC: 15.1 10*3/uL — ABNORMAL HIGH (ref 4.0–10.5)

## 2017-05-25 LAB — BPAM RBC
BLOOD PRODUCT EXPIRATION DATE: 201810082359
BLOOD PRODUCT EXPIRATION DATE: 201810092359
Blood Product Expiration Date: 201810092359
Blood Product Expiration Date: 201810092359
Blood Product Expiration Date: 201810092359
ISSUE DATE / TIME: 201809221056
ISSUE DATE / TIME: 201809240450
ISSUE DATE / TIME: 201809241043
UNIT TYPE AND RH: 6200
UNIT TYPE AND RH: 6200
UNIT TYPE AND RH: 6200
UNIT TYPE AND RH: 6200
Unit Type and Rh: 6200

## 2017-05-25 MED ORDER — METHOCARBAMOL 500 MG PO TABS: 500.0000 mg | ORAL_TABLET | Freq: Four times a day (QID) | ORAL | 0 refills | Status: DC | PRN

## 2017-05-25 MED ORDER — OXYCODONE-ACETAMINOPHEN 5-325 MG PO TABS: 1.0000 | ORAL_TABLET | Freq: Four times a day (QID) | ORAL | 0 refills | Status: DC | PRN

## 2017-05-25 MED ORDER — DOCUSATE SODIUM 100 MG PO CAPS: 100.0000 mg | ORAL_CAPSULE | Freq: Two times a day (BID) | ORAL | 0 refills | Status: DC

## 2017-05-25 MED ORDER — ASPIRIN 81 MG PO CHEW: 81.0000 mg | CHEWABLE_TABLET | Freq: Every day | ORAL | 1 refills | Status: AC

## 2017-05-25 MED ORDER — POLYETHYLENE GLYCOL 3350 17 G PO PACK: 17.0000 g | PACK | Freq: Every day | ORAL | 0 refills | Status: DC

## 2017-05-25 MED ORDER — KETOROLAC TROMETHAMINE 10 MG PO TABS: 10.0000 mg | ORAL_TABLET | Freq: Four times a day (QID) | ORAL | 0 refills | Status: DC | PRN

## 2017-05-25 MED ORDER — KETOROLAC TROMETHAMINE 30 MG/ML IJ SOLN
30.0000 mg | Freq: Once | INTRAMUSCULAR | Status: AC
  Administered 2017-05-25: 30 mg via INTRAVENOUS
  Filled 2017-05-25: qty 1

## 2017-05-25 MED ORDER — OXYCODONE HCL 5 MG PO TABS: 5.0000 mg | ORAL_TABLET | Freq: Four times a day (QID) | ORAL | 0 refills | Status: DC | PRN

## 2017-05-25 NOTE — Discharge Summary (Signed)
[facility] (OTS)  Patient ID: Daniel Stewart MRN: [id number] DOB/AGE: 52-13-1967 52 y.o.  Admit date: 05/19/2017 Discharge date: 05/25/2017 Admission Diagnoses: Machine injury Traumatic dislocation R elbow R brachial artery injury    Discharge Diagnoses:  Principal Problem:   Traumatic dislocation of right elbow Active Problems:   Injury of right brachial artery   Acute blood loss anemia   Procedures Performed: 05/19/2017- Dr. Jena Gauss 1. CPT 24101-I&D of right open elbow joint 2. CPT 24615-Treatment of open elbow dislocation 3. CPT 24345-Repair of medial elbow ligaments 4. CPT 24343-Repair of lateral elbow ligaments 5. CPT 20690-External fixation of right elbow 6. CPT 25020-Decompression of forearm (fasciotomy) 7. CPT 12032-Repair of traumatic wound to elbow 8. CPT 97605-Wound vac placement  05/19/2017- Dr. Myra Gianotti #1 Repair of right brachial artery injury with interposition right greater saphenous vein graft #2: Thrombectomy of right brachial artery  05/22/2017- Dr. Jena Gauss 1. CPT 13160-Secondary closure of right upper extremity fasciotomy wound 2. CPT 15852-Incisional wound vac placement  Discharged Condition: good  Hospital Course:   51 year old male injured while at work sustaining a right open elbow dislocation with right brachial artery injury. Taken to the OR for external fixation of his right elbow along with ligamentous repair. Also had repair of his right brachial artery injury. Prophylactic fasciotomies of his forearm also performed that time. Patient's hospital course was uncomplicated. He was taken back to the OR on 05/22/2017 for closure of his fasciotomies and placement of an incisional wound VAC. Patient continued to progress well. He did require transfusion on postoperative dayto following the second procedure. He tolerated transfusion well and did feel better. Patient was able to mobilize with physical therapy and occupational  therapy. On postoperative day #3 his incisional VAC was removed. All of his wounds look fantastic. I did review wound care with respect to his incisions and his external fixator. Patient and daughter seem to have a clear understanding of care needed to be done. Patient was discharged on aspirin per vascular service protocol for vessel repair. Patient was covered with antibiotics for surgical prophylaxis and open fracture/dislocation treatment.  On 05/25/2017 patient was stable for discharge to home  Consults: vascular surgery  Significant Diagnostic Studies: labs:  Results for Daniel Stewart, Daniel Stewart (MRN [id number]) as of 06/04/2017 09:03  Ref. Range 05/25/2017 06:32  WBC Latest Ref Range: 4.0 - 10.5 K/uL 15.1 (H)  RBC Latest Ref Range: 4.22 - 5.81 MIL/uL 4.45  Hemoglobin Latest Ref Range: 13.0 - 17.0 g/dL 78.2 (L)  HCT Latest Ref Range: 39.0 - 52.0 % 37.3 (L)  MCV Latest Ref Range: 78.0 - 100.0 fL 83.8  MCH Latest Ref Range: 26.0 - 34.0 pg 28.3  MCHC Latest Ref Range: 30.0 - 36.0 g/dL 95.6  RDW Latest Ref Range: 11.5 - 15.5 % 14.5  Platelets Latest Ref Range: 150 - 400 K/uL 344     Treatments: IV hydration, antibiotics: Ancef, analgesia: Tylenol, morphine, OxyIR, anticoagulation: ASA and LMW heparin, therapies: PT, OT and RN and surgery: as above  Discharge Exam:    Orthopedic Trauma Service Progress Note    Patient ID: Daniel Stewart MRN: [id number] DOB/AGE: February 10, 1966 51 y.o.   Subjective:   Doing better  Pain improved Feels good after transfusion with 2 units of PRBCs yesterday No acute issues noted    Ready to go home    Pt has been tachycardic during entire admission, mostly in 110's and occasional 120's after surgery    Denies CP or SOB  Review of Systems  Constitutional: Negative for chills and fever.  Eyes: Negative for blurred vision.  Respiratory: Negative for shortness of breath and wheezing.   Cardiovascular: Negative for chest pain and  palpitations.  Gastrointestinal: Negative for abdominal pain, nausea and vomiting.      Objective:    VITALS:         Vitals:    05/24/17 1113 05/24/17 1430 05/24/17 2021 05/25/17 0458  BP: 119/69 (!) 129/59 112/68 124/76  Pulse: (!) 114 (!) 124 (!) 120 (!) 120  Resp: Temp: 99.3 F (37.4 C)   99.2 F (37.3 C) 99.1 F (37.3 C)  TempSrc: Oral   Oral Oral  SpO2: 97% 98% 94% 97%  Weight:          Height:              Estimated body mass index is 19.73 kg/m as calculated from the following:   Height as of this encounter:  (1.702 m).   Weight as of this encounter: 57.2 kg (126 lb).     Intake/Output      09/24 0701 - 09/25 0700 09/25 0701 - 09/26 0700   P.O. 720 240   Blood 830    Total Intake(mL/kg) 1550 (27.1) 240 (4.2)   Net +1550 +240        Urine Occurrence 4 x       LABS   Lab Results Last 24 Hours       Results for orders placed or performed during the hospital encounter of 05/19/17 (from the past 24 hour(s))  CBC     Status: Abnormal    Collection Time: 05/24/17  6:11 PM  Result Value Ref Range    WBC 12.5 (H) 4.0 - 10.5 K/uL    RBC 4.15 (L) 4.22 - 5.81 MIL/uL    Hemoglobin 11.8 (L) 13.0 - 17.0 g/dL    HCT 81.1 (L) 91.4 - 52.0 %    MCV 83.9 78.0 - 100.0 fL    MCH 28.4 26.0 - 34.0 pg    MCHC 33.9 30.0 - 36.0 g/dL    RDW 78.2 95.6 - 21.3 %    Platelets 331 150 - 400 K/uL  CBC     Status: Abnormal    Collection Time: 05/25/17  6:32 AM  Result Value Ref Range    WBC 15.1 (H) 4.0 - 10.5 K/uL    RBC 4.45 4.22 - 5.81 MIL/uL    Hemoglobin 12.6 (L) 13.0 - 17.0 g/dL    HCT 08.6 (L) 57.8 - 52.0 %    MCV 83.8 78.0 - 100.0 fL    MCH 28.3 26.0 - 34.0 pg    MCHC 33.8 30.0 - 36.0 g/dL    RDW 46.9 62.9 - 52.8 %    Platelets 344 150 - 400 K/uL          PHYSICAL EXAM:    Gen: looks very good, pleasant, NAD, sitting in bedside chair Lungs: clear, unlabored  Cardiac: tachy, regular Ext:       Right Upper Extremity              Ex fix  stable             All dressings removed including incisional vac             All wounds look great, no signs of infection              Lateral traumatic  wound is stable             pinsites look great             R/U/M sensation intact             R/U/M/AIN/PIN motor intact             Ext warm              + radial pulse      Assessment/Plan: 3 Days Post-Op    Principal Problem:   Traumatic dislocation of right elbow Active Problems:   Compartment syndrome of right upper extremity (HCC)   Injury of right brachial artery   Acute blood loss anemia               Anti-infectives     Start     Dose/Rate Route Frequency Ordered Stop    05/22/17 0702   ceFAZolin (ANCEF) 2-4 GM/100ML-% IVPB    Comments:  Rosiland Oz   : cabinet override         05/22/17 0702 05/22/17 0750    05/22/17 0700   ceFAZolin (ANCEF) IVPB 2g/100 mL premix     2 g 200 mL/hr over 30 Minutes Intravenous On call to O.R. 05/22/17 0700 05/22/17 0750    05/20/17 0800   ceFAZolin (ANCEF) IVPB 2g/100 mL premix     2 g 200 mL/hr over 30 Minutes Intravenous Every 6 hours 05/20/17 0717 05/20/17 2018    05/20/17 0717   ceFAZolin (ANCEF) IVPB 2g/100 mL premix  Status:  Discontinued     2 g 200 mL/hr over 30 Minutes Intravenous On call to O.R. 05/20/17 0717 05/20/17 0746    05/19/17 0800   ceFAZolin (ANCEF) IVPB 2g/100 mL premix     2 g 200 mL/hr over 30 Minutes Intravenous  Once 05/19/17 0747 05/19/17 1315     .   POD/HD#: 19   51 y/o male s/p traumatic dislocation R elbow with brachial artery injury    - traumatic dislocation R elbow s/p ex fix and LUCL/MCL              NWB             Sling for comfort              Finger and wrist motion              Dressing changed                         Dressing changes every other day                         Ok to shower once all wounds are dry                         Can shower in ex fix                Follow up with Dr. Jena Gauss in 10 days     - Pain  management:             Current regimen appears effective   - ABL anemia/Hemodynamics             cbc improved             mildy tachy but no other symptoms  Likely related to pain             Pt is very stoic     - DVT/PE prophylaxis:             Ambulate             No pharmacologics   - ID:              periop abx completed   - FEN/GI prophylaxis/Foley/Lines:             Reg diet      - Dispo:             dc home today             Follow up with ortho in 10-14 days     Disposition: 01-Home or Self Care  Discharge Instructions    Call MD / Call 911    Complete by:  As directed    If you experience chest pain or shortness of breath, CALL 911 and be transported to the hospital emergency room.  If you develope a fever above 101 F, pus (white drainage) or increased drainage or redness at the wound, or calf pain, call your surgeon's office.   Constipation Prevention    Complete by:  As directed    Drink plenty of fluids.  Prune juice may be helpful.  You may use a stool softener, such as Colace (over the counter) 100 mg twice a day.  Use MiraLax (over the counter) for constipation as needed.   Diet general    Complete by:  As directed    Discharge instructions    Complete by:  As directed    [facility]thopaedic Trauma Service Discharge Instructions   General Discharge Instructions  WEIGHT BEARING STATUS: nonweightbearing Right arm  RANGE OF MOTION/ACTIVITY: unrestricted range of motion R hand and wrist. Ok to move shoulder. Sling for comfort  Wound Care: dressing changes every other day starting on 05/27/2017. See instructions below.     Discharge Pin Site Instructions  Dress pins daily with Kerlix roll starting on POD 2 (you are beyond post op day 2 (POD 2)). Wrap the Kerlix so that it tamps the skin down around the pin-skin interface to prevent/limit motion of the skin relative to the pin.  (Pin-skin motion is the primary cause of pain and infection related to  external fixator pin sites).  Remove any crust or coagulum that may obstruct drainage with a saline moistened gauze or soap and water.   pin site dressing, the interval for change can by increased to every other day.  You may shower with the fixator, cleaning all pin sites gently with soap and water.  If you have a surgical wound this needs to be completely dry and without drainage before showering.  The extremity can be lifted by the fixator to facilitate wound care and transfers.  Notify the office/Doctor if you experience increasing drainage, redness, or pain from a pin site, or if you notice purulent (thick, snot-like) drainage.  Discharge Wound Care Instructions  Do NOT apply any ointments, solutions or lotions to pin sites or surgical wounds.  These prevent needed drainage and even though solutions like hydrogen peroxide kill bacteria, they also damage cells lining the pin sites that help fight infection.  Applying lotions or ointments can keep the wounds moist and can cause them to breakdown and open up as well. This can increase the risk for infection. When in doubt call the office.  Surgical incisions should be dressed daily or every other day.  If any drainage is noted, use one layer of adaptic/oil emulsion gauze, then gauze, Kerlix, and an ace wrap.  Once the incision is completely dry and without drainage, it may be left open to air out.  Showering may begin 36-48 hours later.  Cleaning gently with soap and water.  Traumatic wounds should be dressed daily as well.    One layer of adaptic, gauze, Kerlix, then ace wrap.  The adaptic can be discontinued once the draining has ceased    If you have a wet to dry dressing: wet the gauze with saline the squeeze as much saline out so the gauze is moist (not soaking wet), place moistened gauze over wound, then place a dry gauze over the moist one, followed by Kerlix wrap, then ace wrap.  DVT/PE prophylaxis:  Aspirin for vascular repair    Diet: as you were eating previously.  Can use over the counter stool softeners and bowel preparations, such as Miralax, to help with bowel movements.  Narcotics can be constipating.  Be sure to drink plenty of fluids  PAIN MEDICATION USE AND EXPECTATIONS  You have likely been given narcotic medications to help control your pain.  After a traumatic event that results in an fracture (broken bone) with or without surgery, it is ok to use narcotic pain medications to help control one's pain.  We understand that everyone responds to pain differently and each individual patient will be evaluated on a regular basis for the continued need for narcotic medications. Ideally, narcotic medication use should last no more than 6-8 weeks (coinciding with fracture healing).   As a patient it is your responsibility as well to monitor narcotic medication use and report the amount and frequency you use these medications when you come to your office visit.   We would also advise that if you are using narcotic medications, you should take a dose prior to therapy to maximize you participation.  IF YOU ARE ON NARCOTIC MEDICATIONS IT IS NOT PERMISSIBLE TO OPERATE A MOTOR VEHICLE (MOTORCYCLE/CAR/TRUCK/MOPED) OR HEAVY MACHINERY DO NOT MIX NARCOTICS WITH OTHER CNS (CENTRAL NERVOUS SYSTEM) DEPRESSANTS SUCH AS ALCOHOL   STOP SMOKING OR USING NICOTINE PRODUCTS!!!!  As discussed nicotine severely impairs your body's ability to heal surgical and traumatic wounds but also impairs bone healing.  Wounds and bone heal by forming microscopic blood vessels (angiogenesis) and nicotine is a vasoconstrictor (essentially, shrinks blood vessels).  Therefore, if vasoconstriction occurs to these microscopic blood vessels they essentially disappear and are unable to deliver necessary nutrients to the healing tissue.  This is one modifiable factor that you can do to dramatically increase your chances of healing your injury.    (This means no  smoking, no nicotine gum, patches, etc)  DO NOT USE NONSTEROIDAL ANTI-INFLAMMATORY DRUGS (NSAID'S)  Using products such as Advil (ibuprofen), Aleve (naproxen), Motrin (ibuprofen) for additional pain control during fracture healing can delay and/or prevent the healing response.  If you would like to take over the counter (OTC) medication, Tylenol (acetaminophen) is ok.  However, some narcotic medications that are given for pain control contain acetaminophen as well. Therefore, you should not exceed more than 4000 mg of tylenol in a day if you do not have liver disease.  Also note that there are may OTC medicines, such as cold medicines and allergy medicines that my contain tylenol as well.  If you have any questions about medications and/or interactions please ask your  doctor/PA or your pharmacist.      ICE AND ELEVATE INJURED/OPERATIVE EXTREMITY  Using ice and elevating the injured extremity above your heart can help with swelling and pain control.  Icing in a pulsatile fashion, such as 20 minutes on and 20 minutes off, can be followed.    Do not place ice directly on skin. Make sure there is a barrier between to skin and the ice pack.    Using frozen items such as frozen peas works well as the conform nicely to the are that needs to be iced.  USE AN ACE WRAP OR TED HOSE FOR SWELLING CONTROL  In addition to icing and elevation, Ace wraps or TED hose are used to help limit and resolve swelling.  It is recommended to use Ace wraps or TED hose until you are informed to stop.    When using Ace Wraps start the wrapping distally (farthest away from the body) and wrap proximally (closer to the body)   Example: If you had surgery on your leg or thing and you do not have a splint on, start the ace wrap at the toes and work your way up to the thigh        If you had surgery on your upper extremity and do not have a splint on, start the ace wrap at your fingers and work your way up to the upper arm  CALL THE  OFFICE WITH ANY QUESTIONS OR CONCERNS: [phone]   Driving restrictions    Complete by:  As directed    No driving   Increase activity slowly as tolerated    Complete by:  As directed    Lifting restrictions    Complete by:  As directed    No lifting   Non weight bearing    Complete by:  As directed    Laterality:  right   Extremity:  Upper     Allergies as of 05/25/2017   No Known Allergies     Medication List    STOP taking these medications   ibuprofen 200 MG tablet Commonly known as:  ADVIL,MOTRIN     TAKE these medications   aspirin 81 MG chewable tablet Chew 1 tablet (81 mg total) by mouth daily.   clobetasol cream 0.05 % Commonly known as:  TEMOVATE Apply 1 application topically daily as needed (for toes).   docusate sodium 100 MG capsule Commonly known as:  COLACE Take 1 capsule (100 mg total) by mouth 2 (two) times daily.   ketorolac 10 MG tablet Commonly known as:  TORADOL Take 1 tablet (10 mg total) by mouth every 6 (six) hours as needed for moderate pain.   methocarbamol 500 MG tablet Commonly known as:  ROBAXIN Take 1-2 tablets (500-1,000 mg total) by mouth every 6 (six) hours as needed for muscle spasms.   multivitamin with minerals Tabs tablet Take 1 tablet by mouth daily as needed (for vitamin).   omeprazole 20 MG capsule Commonly known as:  PRILOSEC Take 20 mg by mouth daily as needed (for acid).   oxyCODONE 5 MG immediate release tablet Commonly known as:  Oxy IR/ROXICODONE Take 1-2 tablets (5-10 mg total) by mouth every 6 (six) hours as needed for breakthrough pain (take between percocet for breakthrough pain only).   oxyCODONE-acetaminophen 5-325 MG tablet Commonly known as:  PERCOCET Take 1-2 tablets by mouth every 6 (six) hours as needed for moderate pain or severe pain.   polyethylene glycol packet Commonly known as:  MIRALAX /  GLYCOLAX Take 17 g by mouth daily.            Discharge Care Instructions        Start      Ordered   05/25/17 0000  Non weight bearing    Question Answer Comment  Laterality right   Extremity Upper      05/25/17 1045       Discharge Instructions and Plan:   50 y/o male s/p traumatic dislocation R elbow with brachial artery injury    - traumatic dislocation R elbow s/p ex fix and LUCL/MCL              NWB             Sling for comfort              Finger and wrist motion              Dressing changed                         Dressing changes every other day                         Ok to shower once all wounds are dry                         Can shower in ex fix                Follow up with Dr. Jena Gauss in 10 days     - Pain management:             Current regimen appears effective   - ABL anemia/Hemodynamics             cbc improved             mildy tachy but no other symptoms              Likely related to pain             Pt is very stoic    Improved with transfusion    - DVT/PE prophylaxis:             Ambulate             No pharmacologics   - ID:              periop abx completed   - FEN/GI prophylaxis/Foley/Lines:             Reg diet      - Dispo:             dc home today             Follow up with ortho in 10-14 days     Signed:  Mearl Latin, PA-C [facility] [phone] (P) 06/04/2017, 8:56 AM

## 2017-05-25 NOTE — Progress Notes (Signed)
Pt's wife signed discharge papers for pt.

## 2017-05-25 NOTE — Care Management Note (Signed)
Case Management Note  Patient Details  Name: Daniel Stewart MRN: [id number] Date of Birth: 04/05/1966  Subjective/Objective:                    Action/Plan: Case manager has spoken with patient's worker's comp nurse case manager , Daniel Stewart, daily concerning discharge plan. She has requested patient have order for Home Health RN for dressing changes and 3in1 for patient comfort. CM has sent message to Daniel Stewart to sign orders. CM has faxed discharge instructions, OP notes and orders to Daniel Stewart at [phone]. 3in1 will be delivered to patient's home.     Expected Discharge Date:  05/25/17               Expected Discharge Plan:  Home w Home Health Services  In-House Referral:  NA  Discharge planning Services  CM Consult  Post Acute Care Choice:  Durable Medical Equipment, Home Health Choice offered to:   (Worker's comp to arrange)  DME Arranged:  3-N-1 DME Agency:   (worker's comp to deliver)  HH Arranged:  RN HH Agency:   (worker's comp to arrange)  Status of Service:  Completed, signed off  If discussed at [employer], dates discussed:    Additional Comments:  Durenda Guthrie, RN 05/25/2017, 12:07 PM

## 2017-05-25 NOTE — Discharge Instructions (Signed)
[facility]edic [facility] Discharge Instructions   General Discharge Instructions  WEIGHT BEARING STATUS: nonweightbearing Right arm  RANGE OF MOTION/ACTIVITY: unrestricted range of motion R hand and wrist. Ok to move shoulder. Sling for comfort  Wound Care: dressing changes every other day starting on 05/27/2017. See instructions below.     Discharge Pin Site Instructions  Dress pins daily with Kerlix roll starting on POD 2 (you are beyond post op day 2 (POD 2)). Wrap the Kerlix so that it tamps the skin down around the pin-skin interface to prevent/limit motion of the skin relative to the pin.  (Pin-skin motion is the primary cause of pain and infection related to external fixator pin sites).  Remove any crust or coagulum that may obstruct drainage with a saline moistened gauze or soap and water.   pin site dressing, the interval for change can by increased to every other day.  You may shower with the fixator, cleaning all pin sites gently with soap and water.  If you have a surgical wound this needs to be completely dry and without drainage before showering.  The extremity can be lifted by the fixator to facilitate wound care and transfers.  Notify the office/Doctor if you experience increasing drainage, redness, or pain from a pin site, or if you notice purulent (thick, snot-like) drainage.  Discharge Wound Care Instructions  Do NOT apply any ointments, solutions or lotions to pin sites or surgical wounds.  These prevent needed drainage and even though solutions like hydrogen peroxide kill bacteria, they also damage cells lining the pin sites that help fight infection.  Applying lotions or ointments can keep the wounds moist and can cause them to breakdown and open up as well. This can increase the risk for infection. When in doubt call the office.  Surgical incisions should be dressed daily or every other day.  If any drainage is noted, use one layer of adaptic/oil emulsion  gauze, then gauze, Kerlix, and an ace wrap.  Once the incision is completely dry and without drainage, it may be left open to air out.  Showering may begin 36-48 hours later.  Cleaning gently with soap and water.  Traumatic wounds should be dressed daily as well.    One layer of adaptic, gauze, Kerlix, then ace wrap.  The adaptic can be discontinued once the draining has ceased    If you have a wet to dry dressing: wet the gauze with saline the squeeze as much saline out so the gauze is moist (not soaking wet), place moistened gauze over wound, then place a dry gauze over the moist one, followed by Kerlix wrap, then ace wrap.  DVT/PE prophylaxis:  Aspirin for vascular repair   Diet: as you were eating previously.  Can use over the counter stool softeners and bowel preparations, such as Miralax, to help with bowel movements.  Narcotics can be constipating.  Be sure to drink plenty of fluids  PAIN MEDICATION USE AND EXPECTATIONS  You have likely been given narcotic medications to help control your pain.  After a traumatic event that results in an fracture (broken bone) with or without surgery, it is ok to use narcotic pain medications to help control one's pain.  We understand that everyone responds to pain differently and each individual patient will be evaluated on a regular basis for the continued need for narcotic medications. Ideally, narcotic medication use should last no more than 6-8 weeks (coinciding with fracture healing).   As a patient it is your  responsibility as well to monitor narcotic medication use and report the amount and frequency you use these medications when you come to your office visit.   We would also advise that if you are using narcotic medications, you should take a dose prior to therapy to maximize you participation.  IF YOU ARE ON NARCOTIC MEDICATIONS IT IS NOT PERMISSIBLE TO OPERATE A MOTOR VEHICLE (MOTORCYCLE/CAR/TRUCK/MOPED) OR HEAVY MACHINERY DO NOT MIX NARCOTICS  WITH OTHER CNS (CENTRAL NERVOUS SYSTEM) DEPRESSANTS SUCH AS ALCOHOL   STOP SMOKING OR USING NICOTINE PRODUCTS!!!!  As discussed nicotine severely impairs your body's ability to heal surgical and traumatic wounds but also impairs bone healing.  Wounds and bone heal by forming microscopic blood vessels (angiogenesis) and nicotine is a vasoconstrictor (essentially, shrinks blood vessels).  Therefore, if vasoconstriction occurs to these microscopic blood vessels they essentially disappear and are unable to deliver necessary nutrients to the healing tissue.  This is one modifiable factor that you can do to dramatically increase your chances of healing your injury.    (This means no smoking, no nicotine gum, patches, etc)  DO NOT USE NONSTEROIDAL ANTI-INFLAMMATORY DRUGS (NSAID'S)  Using products such as Advil (ibuprofen), Aleve (naproxen), Motrin (ibuprofen) for additional pain control during fracture healing can delay and/or prevent the healing response.  If you would like to take over the counter (OTC) medication, Tylenol (acetaminophen) is ok.  However, some narcotic medications that are given for pain control contain acetaminophen as well. Therefore, you should not exceed more than 4000 mg of tylenol in a day if you do not have liver disease.  Also note that there are may OTC medicines, such as cold medicines and allergy medicines that my contain tylenol as well.  If you have any questions about medications and/or interactions please ask your doctor/PA or your pharmacist.      ICE AND ELEVATE INJURED/OPERATIVE EXTREMITY  Using ice and elevating the injured extremity above your heart can help with swelling and pain control.  Icing in a pulsatile fashion, such as 20 minutes on and 20 minutes off, can be followed.    Do not place ice directly on skin. Make sure there is a barrier between to skin and the ice pack.    Using frozen items such as frozen peas works well as the conform nicely to the are that  needs to be iced.  USE AN ACE WRAP OR TED HOSE FOR SWELLING CONTROL  In addition to icing and elevation, Ace wraps or TED hose are used to help limit and resolve swelling.  It is recommended to use Ace wraps or TED hose until you are informed to stop.    When using Ace Wraps start the wrapping distally (farthest away from the body) and wrap proximally (closer to the body)   Example: If you had surgery on your leg or thing and you do not have a splint on, start the ace wrap at the toes and work your way up to the thigh        If you had surgery on your upper extremity and do not have a splint on, start the ace wrap at your fingers and work your way up to the upper arm  CALL THE OFFICE WITH ANY QUESTIONS OR CONCERNS: [phone]

## 2017-05-25 NOTE — Progress Notes (Signed)
Orthopedic Trauma Service Progress Note   Patient ID: Daniel Stewart MRN: [id number] DOB/AGE: 10/11/1965 51 y.o.  Subjective:  Doing better  Pain improved Feels good after transfusion with 2 units of PRBCs yesterday No acute issues noted   Ready to go home   Pt has been tachycardic during entire admission, mostly in 110's and occasional 120's after surgery   Denies CP or SOB    Review of Systems  Constitutional: Negative for chills and fever.  Eyes: Negative for blurred vision.  Respiratory: Negative for shortness of breath and wheezing.   Cardiovascular: Negative for chest pain and palpitations.  Gastrointestinal: Negative for abdominal pain, nausea and vomiting.    Objective:   VITALS:   Vitals:   05/24/17 1113 05/24/17 1430 05/24/17 2021 05/25/17 0458  BP: 119/69 (!) 129/59 112/68 124/76  Pulse: (!) 114 (!) 124 (!) 120 (!) 120  Resp: Temp: 99.3 F (37.4 C)  99.2 F (37.3 C) 99.1 F (37.3 C)  TempSrc: Oral  Oral Oral  SpO2: 97% 98% 94% 97%  Weight:      Height:        Estimated body mass index is 19.73 kg/m as calculated from the following:   Height as of this encounter:  (1.702 m).   Weight as of this encounter: 57.2 kg (126 lb).   Intake/Output      09/24 0701 - 09/25 0700 09/25 0701 - 09/26 0700   P.O. 720 240   Blood 830    Total Intake(mL/kg) 1550 (27.1) 240 (4.2)   Net +1550 +240        Urine Occurrence 4 x      LABS  Results for orders placed or performed during the hospital encounter of 05/19/17 (from the past 24 hour(s))  CBC     Status: Abnormal   Collection Time: 05/24/17  6:11 PM  Result Value Ref Range   WBC 12.5 (H) 4.0 - 10.5 K/uL   RBC 4.15 (L) 4.22 - 5.81 MIL/uL   Hemoglobin 11.8 (L) 13.0 - 17.0 g/dL   HCT 40.9 (L) 81.1 - 91.4 %   MCV 83.9 78.0 - 100.0 fL   MCH 28.4 26.0 - 34.0 pg   MCHC 33.9 30.0 - 36.0 g/dL   RDW 78.2 95.6 - 21.3 %   Platelets 331 150 - 400 K/uL  CBC      Status: Abnormal   Collection Time: 05/25/17  6:32 AM  Result Value Ref Range   WBC 15.1 (H) 4.0 - 10.5 K/uL   RBC 4.45 4.22 - 5.81 MIL/uL   Hemoglobin 12.6 (L) 13.0 - 17.0 g/dL   HCT 08.6 (L) 57.8 - 46.9 %   MCV 83.8 78.0 - 100.0 fL   MCH 28.3 26.0 - 34.0 pg   MCHC 33.8 30.0 - 36.0 g/dL   RDW 62.9 52.8 - 41.3 %   Platelets 344 150 - 400 K/uL     PHYSICAL EXAM:   Gen: looks very good, pleasant, NAD, sitting in bedside chair Lungs: clear, unlabored  Cardiac: tachy, regular Ext:       Right Upper Extremity   Ex fix stable  All dressings removed including incisional vac  All wounds look great, no signs of infection   Lateral traumatic wound is stable  pinsites look great  R/U/M sensation intact             R/U/M/AIN/PIN motor intact  Ext warm              + radial pulse    Assessment/Plan: 3 Days Post-Op   Principal Problem:   Traumatic dislocation of right elbow Active Problems:   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 05/22/17 0700 05/22/17 0750   05/20/17 0800  ceFAZolin (ANCEF) IVPB 2g/100 mL premix     2 g 200 mL/hr over 30 Minutes Intravenous Every 6 hours 05/20/17 0717 05/20/17 2018   05/20/17 0717  ceFAZolin (ANCEF) IVPB 2g/100 mL premix  Status:  Discontinued     2 g 200 mL/hr over 30 Minutes Intravenous On call to O.R. 05/20/17 0717 05/20/17 0746   05/19/17 0800  ceFAZolin (ANCEF) IVPB 2g/100 mL premix     2 g 200 mL/hr over 30 Minutes Intravenous  Once 05/19/17 0747 05/19/17 1315    .  POD/HD#: 60  51 y/o male s/p traumatic dislocation R  elbow with brachial artery injury    - traumatic dislocation R elbow s/p ex fix and LUCL/MCL              NWB             Sling for comfort              Finger and wrist motion              Dressing changed   Dressing changes every other day   Ok to shower once all wounds are dry   Can shower in ex fix         Follow up with Dr. Jena Gauss in 10 days     - Pain management:             Current regimen appears effective   - ABL anemia/Hemodynamics             cbc improved  mildy tachy but no other symptoms   Likely related to pain  Pt is very stoic     - DVT/PE prophylaxis:             Ambulate             No pharmacologics   - ID:              periop abx completed   - FEN/GI prophylaxis/Foley/Lines:             Reg diet      - Dispo:             dc home today  Follow up with ortho in 10-14 days    Mearl Latin, PA-C [facility] [phone] (P) [phone] Traci Sermon (C) 05/25/2017, 10:26 AM

## 2017-05-25 NOTE — Progress Notes (Signed)
PT Cancellation Note  Patient Details Name: Daniel Stewart MRN: [id number] DOB: 1965/12/10   Cancelled Treatment:    Reason Eval/Treat Not Completed: Other (comment). PT ordered again for ambulation, although pt d/c acute PT after initial evaluation on 05/20/17 and encouraged to ambulate in hallway with wife or NT (see note on 9/20 for details). Discussed need for continued amb with RN Maralyn Sago) and NT Darl Pikes) this morning; pt has no further questions or PT needs, and is agreeable to continue amb as much as possible (with assist for line/leads) during hospital stay.  Ina Homes, PT, DPT Acute Rehab Services  Pager: [phone]  [name] 05/25/2017, 9:00 AM

## 2017-05-25 NOTE — Progress Notes (Signed)
Pt discharge instructions reviewed with pt. Pt verbalizes understanding. Pt's wife is driving him home. Pt belongings with pt. Pt is not in distress. Pt discharged via wheelchair.  

## 2017-06-28 ENCOUNTER — Ambulatory Visit (INDEPENDENT_AMBULATORY_CARE_PROVIDER_SITE_OTHER): Payer: Worker's Compensation | Admitting: Surgery

## 2017-06-28 ENCOUNTER — Encounter: Payer: Self-pay | Admitting: Surgery

## 2017-06-28 VITALS — BP 150/95 | HR 93 | Temp 98.0°F | Resp 16 | Ht 67.0 in | Wt 115.0 lb

## 2017-06-28 DIAGNOSIS — S55101A Unspecified injury of radial artery at forearm level, right arm, initial encounter: Secondary | ICD-10-CM

## 2017-06-28 NOTE — Progress Notes (Signed)
  POST OPERATIVE OFFICE NOTE    CC:  F/u for surgery  HPI:  This is a 51 y.o. male who is s/p Procedure:    Repair of right brachial artery injury with interposition right greater saphenous vein graft, Thrombectomy of right brachial artery.  He is in PT for mobility.  He still has numbness in the median nerve distribution and limited range of motion due to left open elbow dislocation.  He is being followed by Dr. Jena GaussHaddix.    No changes in medical history since he was last seen.                           No Known Allergies  Current Outpatient Prescriptions  Medication Sig Dispense Refill  . aspirin 81 MG chewable tablet Chew 1 tablet (81 mg total) by mouth daily. 60 tablet 1  . clobetasol cream (TEMOVATE) 0.05 % Apply 1 application topically daily as needed (for toes).    Marland Kitchen. docusate sodium (COLACE) 100 MG capsule Take 1 capsule (100 mg total) by mouth 2 (two) times daily. 30 capsule 0  . ketorolac (TORADOL) 10 MG tablet Take 1 tablet (10 mg total) by mouth every 6 (six) hours as needed for moderate pain. 20 tablet 0  . methocarbamol (ROBAXIN) 500 MG tablet Take 1-2 tablets (500-1,000 mg total) by mouth every 6 (six) hours as needed for muscle spasms. 60 tablet 0  . Multiple Vitamin (MULTIVITAMIN WITH MINERALS) TABS tablet Take 1 tablet by mouth daily as needed (for vitamin).    Marland Kitchen. omeprazole (PRILOSEC) 20 MG capsule Take 20 mg by mouth daily as needed (for acid).    Marland Kitchen. oxyCODONE-acetaminophen (PERCOCET) 5-325 MG tablet Take 1-2 tablets by mouth every 6 (six) hours as needed for moderate pain or severe pain. 56 tablet 0  . polyethylene glycol (MIRALAX / GLYCOLAX) packet Take 17 g by mouth daily. 14 each 0  . oxyCODONE (OXY IR/ROXICODONE) 5 MG immediate release tablet Take 1-2 tablets (5-10 mg total) by mouth every 6 (six) hours as needed for breakthrough pain (take between percocet for breakthrough pain only). (Patient not taking: Reported on 06/28/2017) 20 tablet 0   No current  facility-administered medications for this visit.      ROS:  See HPI  Physical Exam:  Vitals:   06/28/17 1342 06/28/17 1346  BP: (!) 146/86 (!) 150/95  Pulse: 93 93  Resp: 16   Temp: 98 F (36.7 C)   SpO2: 99%     Incision:  Healing well  Extremities:  Palpable radial pulse 2+, skin is warm to touch Heart RRR Lungs non labored breathing  Assessment/Plan:  This is a 51 y.o. male who is s/p:   Repair of right brachial artery injury with interposition right greater saphenous vein graft, Thrombectomy of right brachial artery.  Patent by pass graft with palpable radial pulse.  We will defer work restrictions to Dr. [facility].     Thomasena EdisCOLLINS, EMMA [facility] CenterMAUREEN PA-C [facility] [phone]  Clinic MD:  Myra GianottiBrabham  The patient is status post repair of his right brachial artery with interposition right great saphenous vein.  This was following a traumatic injury.  From a vascular perspective he is doing very well.  I will have him back in 3 months to get a baseline duplex ultrasound.  He will continue to take aspirin.  He will get annual surveillance.  Durene CalWells Crysten Kaman

## 2017-06-28 NOTE — Progress Notes (Signed)
Vitals:   06/28/17 1342  BP: (!) 146/86  Pulse: 93  Resp: 16  Temp: 98 F (36.7 C)  SpO2: 99%  Weight: 115 lb (52.2 kg)  Height: 5\' 7"  (1.702 m)

## 2017-06-30 NOTE — Addendum Note (Signed)
Addended by: Burton ApleyPETTY, Chardae Mulkern A on: 06/30/2017 04:32 PM   Modules accepted: Orders

## 2017-10-04 ENCOUNTER — Ambulatory Visit (HOSPITAL_COMMUNITY)
Admission: RE | Admit: 2017-10-04 | Discharge: 2017-10-04 | Disposition: A | Discharge status: Home / Self Care | Payer: Worker's Compensation | Source: Ambulatory Visit | Attending: Surgery | Admitting: Surgery

## 2017-10-04 ENCOUNTER — Encounter: Payer: Self-pay | Admitting: Surgery

## 2017-10-04 ENCOUNTER — Ambulatory Visit (INDEPENDENT_AMBULATORY_CARE_PROVIDER_SITE_OTHER): Payer: Worker's Compensation | Admitting: Surgery

## 2017-10-04 VITALS — BP 137/82 | HR 108 | Temp 97.9°F | Resp 18 | Ht 67.0 in | Wt 122.0 lb

## 2017-10-04 DIAGNOSIS — X58XXXA Exposure to other specified factors, initial encounter: Secondary | ICD-10-CM | POA: Diagnosis not present

## 2017-10-04 DIAGNOSIS — S55101A Unspecified injury of radial artery at forearm level, right arm, initial encounter: Secondary | ICD-10-CM

## 2017-10-04 NOTE — Progress Notes (Signed)
[facility]  Patient name: Daniel Stewart MRN: [id number] DOB: [birth year] Sex: male   REASON FOR VISIT:    Follow up  HISOTRY OF PRESENT ILLNESS:    Daniel Stewart is a 52 y.o. male who returns today for follow-up.  He initially presented to the emergency department on 05/20/2017 with a work injury to the right arm.  He was taken to the operating room emergently and underwent repair of the right brachial artery with interposition right greater saphenous vein graft.  He had further procedures and fasciotomies performed by Dr. Jena Gauss.  He is back today for baseline duplex.  I have recommended aspirin for antiplatelet therapy as well as annual graft surveillance.  He is being referred to [facility] for further therapy.   PAST MEDICAL HISTORY:   Past Medical History:  Diagnosis Date  . 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 Traumatic dislocation of right elbow 05/24/2017     FAMILY HISTORY:   Family History  Problem Relation Age of Onset  . Diabetes Mother   . Cancer Father     SOCIAL HISTORY:   Social History   Tobacco Use  . Smoking status: Former Smoker    Types: Cigarettes    Last attempt to quit: 06/29/2007    Years since quitting: 10.2  . Smokeless tobacco: Never Used  Substance Use Topics  . Alcohol use: No    Alcohol/week: 0.0 oz     ALLERGIES:   No Known Allergies   CURRENT MEDICATIONS:   Current Outpatient Medications  Medication Sig Dispense Refill  . aspirin 81 MG chewable tablet Chew 1 tablet (81 mg total) by mouth daily. 60 tablet 1  . clobetasol cream (TEMOVATE) 0.05 % Apply 1 application topically daily as needed (for toes).    Marland Kitchen docusate sodium (COLACE) 100 MG capsule Take 1 capsule (100 mg total) by mouth 2 (two) times daily. 30 capsule 0  . ketorolac (TORADOL) 10 MG tablet Take 1 tablet (10 mg total) by mouth every 6 (six) hours as needed for moderate  pain. 20 tablet 0  . methocarbamol (ROBAXIN) 500 MG tablet Take 1-2 tablets (500-1,000 mg total) by mouth every 6 (six) hours as needed for muscle spasms. 60 tablet 0  . Multiple Vitamin (MULTIVITAMIN WITH MINERALS) TABS tablet Take 1 tablet by mouth daily as needed (for vitamin).    Marland Kitchen omeprazole (PRILOSEC) 20 MG capsule Take 20 mg by mouth daily as needed (for acid).    Marland Kitchen oxyCODONE (OXY IR/ROXICODONE) 5 MG immediate release tablet Take 1-2 tablets (5-10 mg total) by mouth every 6 (six) hours as needed for breakthrough pain (take between percocet for breakthrough pain only). 20 tablet 0  . oxyCODONE-acetaminophen (PERCOCET) 5-325 MG tablet Take 1-2 tablets by mouth every 6 (six) hours as needed for moderate pain or severe pain. 56 tablet 0  . polyethylene glycol (MIRALAX / GLYCOLAX) packet Take 17 g by mouth daily. 14 each 0   No current facility-administered medications for this visit.     REVIEW OF SYSTEMS:   [X]  denotes positive finding, [ ]  denotes negative finding Cardiac  Comments:  Chest pain or chest pressure:    Shortness of breath upon exertion:    Short of breath when lying flat:    Irregular heart rhythm:        Vascular    Pain in calf, thigh, or hip brought on by ambulation:    Pain in feet at night that  wakes you up from your sleep:     Blood clot in your veins:    Leg swelling:         Pulmonary    Oxygen at home:    Productive cough:     Wheezing:         Neurologic    Sudden weakness in arms or legs:     Sudden numbness in arms or legs:     Sudden onset of difficulty speaking or slurred speech:    Temporary loss of vision in one eye:     Problems with dizziness:         Gastrointestinal    Blood in stool:     Vomited blood:         Genitourinary    Burning when urinating:     Blood in urine:        Psychiatric    Major depression:         Hematologic    Bleeding problems:    Problems with blood clotting too easily:        Skin    Rashes or  ulcers:        Constitutional    Fever or chills:      PHYSICAL EXAM:   Vitals:   10/04/17 0936  BP: 137/82  Pulse: (!) 108  Resp: 18  Temp: 97.9 F (36.6 C)  TempSrc: Oral  SpO2: 96%  Weight: 122 lb (55.3 kg)  Height: 5\' 7"  (1.702 m)    GENERAL: The patient is a well-nourished male, in no acute distress. The vital signs are documented above. CARDIAC: There is a regular rate and rhythm.  VASCULAR: Palpable right brachial and radial pulse PULMONARY: Non-labored respirations MUSCULOSKELETAL: Contracture right arm NEUROLOGIC: No focal weakness or paresthesias are detected. SKIN: There are no ulcers or rashes noted. PSYCHIATRIC: The patient has a normal affect.  STUDIES:   I have ordered and reviewed his duplex.  The bypass graft appears to be widely patent.  There are triphasic waveforms distal to the bypass  MEDICAL ISSUES:   Status post brachial artery repair from trauma.  The patient is doing very well from my perspective.  He has no restrictions.  [facility]  I want him to stay on aspirin and get annual duplex surveillance.  We discussed the signs and symptoms of acute failure of the bypass graft and to come to the emergency department should these occur.    Durene CalWells Brabham, MD [facility] Tel [phone] Pager [phone]

## 2017-10-06 DIAGNOSIS — G90511 Complex regional pain syndrome I of right upper limb: Secondary | ICD-10-CM | POA: Insufficient documentation

## 2017-10-06 DIAGNOSIS — M25641 Stiffness of right hand, not elsewhere classified: Secondary | ICD-10-CM | POA: Insufficient documentation

## 2017-10-06 DIAGNOSIS — M7501 Adhesive capsulitis of right shoulder: Secondary | ICD-10-CM | POA: Insufficient documentation

## 2019-02-27 ENCOUNTER — Other Ambulatory Visit: Payer: Self-pay

## 2019-02-27 DIAGNOSIS — S55101A Unspecified injury of radial artery at forearm level, right arm, initial encounter: Secondary | ICD-10-CM

## 2019-03-01 ENCOUNTER — Other Ambulatory Visit: Payer: Self-pay

## 2019-03-01 ENCOUNTER — Ambulatory Visit (INDEPENDENT_AMBULATORY_CARE_PROVIDER_SITE_OTHER): Payer: Managed Care, Other (non HMO) | Admitting: Family

## 2019-03-01 ENCOUNTER — Ambulatory Visit (HOSPITAL_COMMUNITY)
Admission: RE | Admit: 2019-03-01 | Discharge: 2019-03-01 | Disposition: A | Discharge status: Home / Self Care | Payer: Managed Care, Other (non HMO) | Source: Ambulatory Visit | Attending: Family | Admitting: Family

## 2019-03-01 ENCOUNTER — Encounter: Payer: Self-pay | Admitting: Family

## 2019-03-01 VITALS — BP 132/85 | HR 96 | Temp 97.2°F | Resp 16 | Ht 67.0 in | Wt 128.0 lb

## 2019-03-01 DIAGNOSIS — S55101A Unspecified injury of radial artery at forearm level, right arm, initial encounter: Secondary | ICD-10-CM | POA: Diagnosis not present

## 2019-03-01 DIAGNOSIS — G8921 Chronic pain due to trauma: Secondary | ICD-10-CM

## 2019-03-01 NOTE — Progress Notes (Signed)
[facility] [place]   CC: Follow up RUE bypass graft after injury  History of Present Illness Traeton S Mcgregor is a 53 y.o. male returns today for follow up. He initially presented to the emergency department on 05/20/2017 with a work injury to the right arm.  He was taken to the operating room emergently and underwent repair of the right brachial artery with interposition right greater saphenous vein graft by Dr. Trula Slade.   He had further procedures and fasciotomies performed by Dr. Doreatha Martin.   Dr. Trula Slade last evaluated pt on 10-04-17. At that time he recommended aspirin for antiplatelet therapy as well as annual graft surveillance.  He was being referred to [facility] for further therapy. The bypass graft appeared to be widely patent.  There were triphasic waveforms distal to the bypass. The patient was doing very well from Dr. Stephens Shire perspective.  The patient had no restrictions.  He was to get more therapy done at [facility]. Dr. Trula Slade advised pt to stay on aspirin and get annual duplex surveillance.  Dr. Trula Slade discussed with pt the signs and symptoms of acute failure of the bypass graft and to come to the emergency department should these occur.  Pt states he has had several modalities to address his severe pain in his right arm and nothing helped; he is in a pain management clinic in [place], Dr. Mechele Dawley.   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 He is unable to grasp with his right hand. He also states severe tingling and numbness in his right hand and arm.  He was right hand dominant. He is trying to use his left hand. He states he has gone through physical therapy and has been discharged.   He has a supportive brace for his right arm.  He rides a stationary bike in his home, and walks outside when he can.   He states he wonders if he would have less pain if his right arm was amputated.   Diabetic: No Tobacco  use: former smoker, quit in 2008  Pt meds include: Statin :No Betablocker: No ASA: Yes Other anticoagulants/antiplatelets: no  Past Medical History:  Diagnosis Date  . 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 Traumatic dislocation of right elbow 05/24/2017    Social History Social History   Tobacco Use  . Smoking status: Former Smoker    Types: Cigarettes    Quit date: 06/29/2007    Years since quitting: 11.6  . Smokeless tobacco: Never Used  Substance Use Topics  . Alcohol use: No    Alcohol/week: 0.0 standard drinks  . Drug use: No    Family History Family History  Problem Relation Age of Onset  . Diabetes Mother   . Cancer Father     Past Surgical History:  Procedure Laterality Date  . ARTERY REPAIR Right 05/19/2017   Procedure: BRACHIAL ARTERY REPAIR;  Surgeon: Serafina Mitchell, MD;  Location: [facility] OR;  Service: Vascular;  Laterality: Right;  . EXTERNAL FIXATION ARM Right 05/19/2017   Procedure: EXTERNAL FIXATION ARM;  Surgeon: Shona Needles, MD;  Location: [place];  Service: Orthopedics;  Laterality: Right;  . I&D EXTREMITY Right 05/19/2017   Procedure: IRRIGATION AND DEBRIDEMENT EXTREMITY/POSS. FAS/POSS. VAS;  Surgeon: Shona Needles, MD;  Location: [place];  Service: Orthopedics;  Laterality: Right;  . SECONDARY CLOSURE OF WOUND Right 05/22/2017   Procedure: SECONDARY CLOSURE OF WOUND;  Surgeon: Shona Needles, MD;  Location: [facility] OR;  Service: Orthopedics;  Laterality: Right;    No Known Allergies  Current Outpatient Medications  Medication Sig Dispense Refill  . aspirin 81 MG chewable tablet Chew 1 tablet (81 mg total) by mouth daily. 60 tablet 1  . clobetasol cream (TEMOVATE) 0.05 % Apply 1 application topically daily as needed (for toes).    Marland Kitchen. docusate sodium (COLACE) 100 MG capsule Take 1 capsule (100 mg total) by mouth 2 (two) times daily. 30 capsule 0  . gabapentin (NEURONTIN) 300 MG capsule Take 300 mg by mouth 5 (five) times daily.    Marland Kitchen. ketorolac  (TORADOL) 10 MG tablet Take 1 tablet (10 mg total) by mouth every 6 (six) hours as needed for moderate pain. (Patient taking differently: Take 200 mg by mouth daily. ) 20 tablet 0  . Multiple Vitamin (MULTIVITAMIN WITH MINERALS) TABS tablet Take 1 tablet by mouth daily as needed (for vitamin).    Marland Kitchen. omeprazole (PRILOSEC) 20 MG capsule Take 20 mg by mouth daily as needed (for acid).    . polyethylene glycol (MIRALAX / GLYCOLAX) packet Take 17 g by mouth daily. 14 each 0  . methocarbamol (ROBAXIN) 500 MG tablet Take 1-2 tablets (500-1,000 mg total) by mouth every 6 (six) hours as needed for muscle spasms. (Patient not taking: Reported on 03/01/2019) 60 tablet 0  . oxyCODONE (OXY IR/ROXICODONE) 5 MG immediate release tablet Take 1-2 tablets (5-10 mg total) by mouth every 6 (six) hours as needed for breakthrough pain (take between percocet for breakthrough pain only). (Patient not taking: Reported on 03/01/2019) 20 tablet 0  . oxyCODONE-acetaminophen (PERCOCET) 5-325 MG tablet Take 1-2 tablets by mouth every 6 (six) hours as needed for moderate pain or severe pain. (Patient not taking: Reported on 03/01/2019) 56 tablet 0   No current facility-administered medications for this visit.     ROS: See HPI for pertinent positives and negatives.   Physical Examination  Vitals:   03/01/19 0935  BP: 132/85  Pulse: 96  Resp: 16  Temp: (!) 97.2 F (36.2 C)  TempSrc: Temporal  SpO2: 98%  Weight: 128 lb (58.1 kg)  Height: 5\' 7"  (1.702 m)   Body mass index is 20.05 kg/m.  General: A&O x 3, WDWN, slim fit appearing male. Gait: normal HENT: No gross abnormalities.  Eyes: PERRLA. Pulmonary: Respirations are non labored, CTAB, good air movement in all fields Cardiac: regular rhythm, no detected murmur.         Carotid Bruits Right Left   Negative Negative   Radial pulses: 2+ right, 1+ left palpable   Adominal aortic pulse is not palpable                         VASCULAR EXAM: Extremities without  ischemic changes, without Gangrene; without open wounds. Old well healed scar the length of his right arm.  LE Pulses Right Left       POPLITEAL  not palpable   not palpable       POSTERIOR TIBIAL  2+ palpable   2+ palpable        DORSALIS PEDIS      ANTERIOR TIBIAL not palpable   not palpable    Abdomen: soft, NT, no palpable masses. Skin: no rashes, no cellulitis, no ulcers noted. Musculoskeletal: no muscle wasting or atrophy.  Neurologic: A&O X 3; appropriate affect, Sensation is normal except right upper extremity which is moderately painful to touch, decreased sensation to touch at right fingers, hand, and right arm; MOTOR FUNCTION:  moving all extremities equally, motor strength 5/5 throughout except right hand grip strength is 2/5. Speech is fluent/normal. CN 2-12 intact. Psychiatric: Thought content is normal, mood appropriate for clinical situation.     ASSESSMENT: Burt KnackSouksavath S Polzin is a 53 y.o. male who initially presented to the emergency department on 05/20/2017 with a work injury to the right arm.  He was taken to the operating room emergently and underwent repair of the right brachial artery with interposition right greater saphenous vein graft by Dr. Myra GianottiBrabham.   He had further procedures and fasciotomies performed by Dr. Jena GaussHaddix.   He has chronic severe pain in his right upper extremity, managed by a pain management clinic in [place].  He has little us of his right upper extremity.  There are no signs of ischemia in his right hand or fingers.   Today's right UE arterial duplex shows a widely patent right upper extremity bypass graft without evidence of stenosis.    DATA  Right Upper Extremity Arterial Duplex (03-01-19): Right Graft #1: +------------------+--------+--------+---------+--------+                   PSV cm/sStenosisWaveform  Comments +------------------+--------+--------+---------+--------+ Inflow            81              triphasic         +------------------+--------+--------+---------+--------+ Prox Anastomosis  251             triphasic         +------------------+--------+--------+---------+--------+ Proximal Graft    185             biphasic          +------------------+--------+--------+---------+--------+ Mid Graft         43              biphasic          +------------------+--------+--------+---------+--------+ Distal Graft      51              triphasic         +------------------+--------+--------+---------+--------+ Distal Anastomosis48              triphasic         +------------------+--------+--------+---------+--------+ Outflow           32              triphasic         +------------------+--------+--------+---------+--------+ Summary: Right: Widely patent right upper extremity bypass graft without        evidence of stenosis.    PLAN:  Based on the patient's vascular studies and examination, pt will return to clinic in 1 year with right UE arterial duplex.  I advised him to notify us if he develops concerns re the circulation in his right hand or arm.   I discussed  in depth with the patient the nature of atherosclerosis, and emphasized the importance of maximal medical management including strict control of blood pressure, blood glucose, and lipid levels, obtaining regular exercise, and continued cessation of smoking.  The patient is aware that without maximal medical management the underlying atherosclerotic disease process will progress, limiting the benefit of any interventions.  The patient was given information about PAD including signs, symptoms, treatment, what symptoms should prompt the patient to seek immediate medical care, and risk reduction measures to take.  Charisse MarchSuzanne Susy Placzek, RN, MSN, FNP-C [facility]  [place] Office Phone: [phone]  Clinic MD: Edilia BoDickson  03/01/19 10:01 AM

## 2019-08-25 IMAGING — DX DG ELBOW COMPLETE 3+V*R*
1 series · 3 of 3 positions shown · non-contrast
Comparison: None.

CLINICAL DATA: The patient's right forearm was caught in a machine
and pulled. Pain and swelling. Initial encounter.

EXAM:
RIGHT ELBOW - COMPLETE 3+ VIEW

[Series 1: elbow · 0.14mm/px · 3 of 3 slices shown]
[im 1/3]
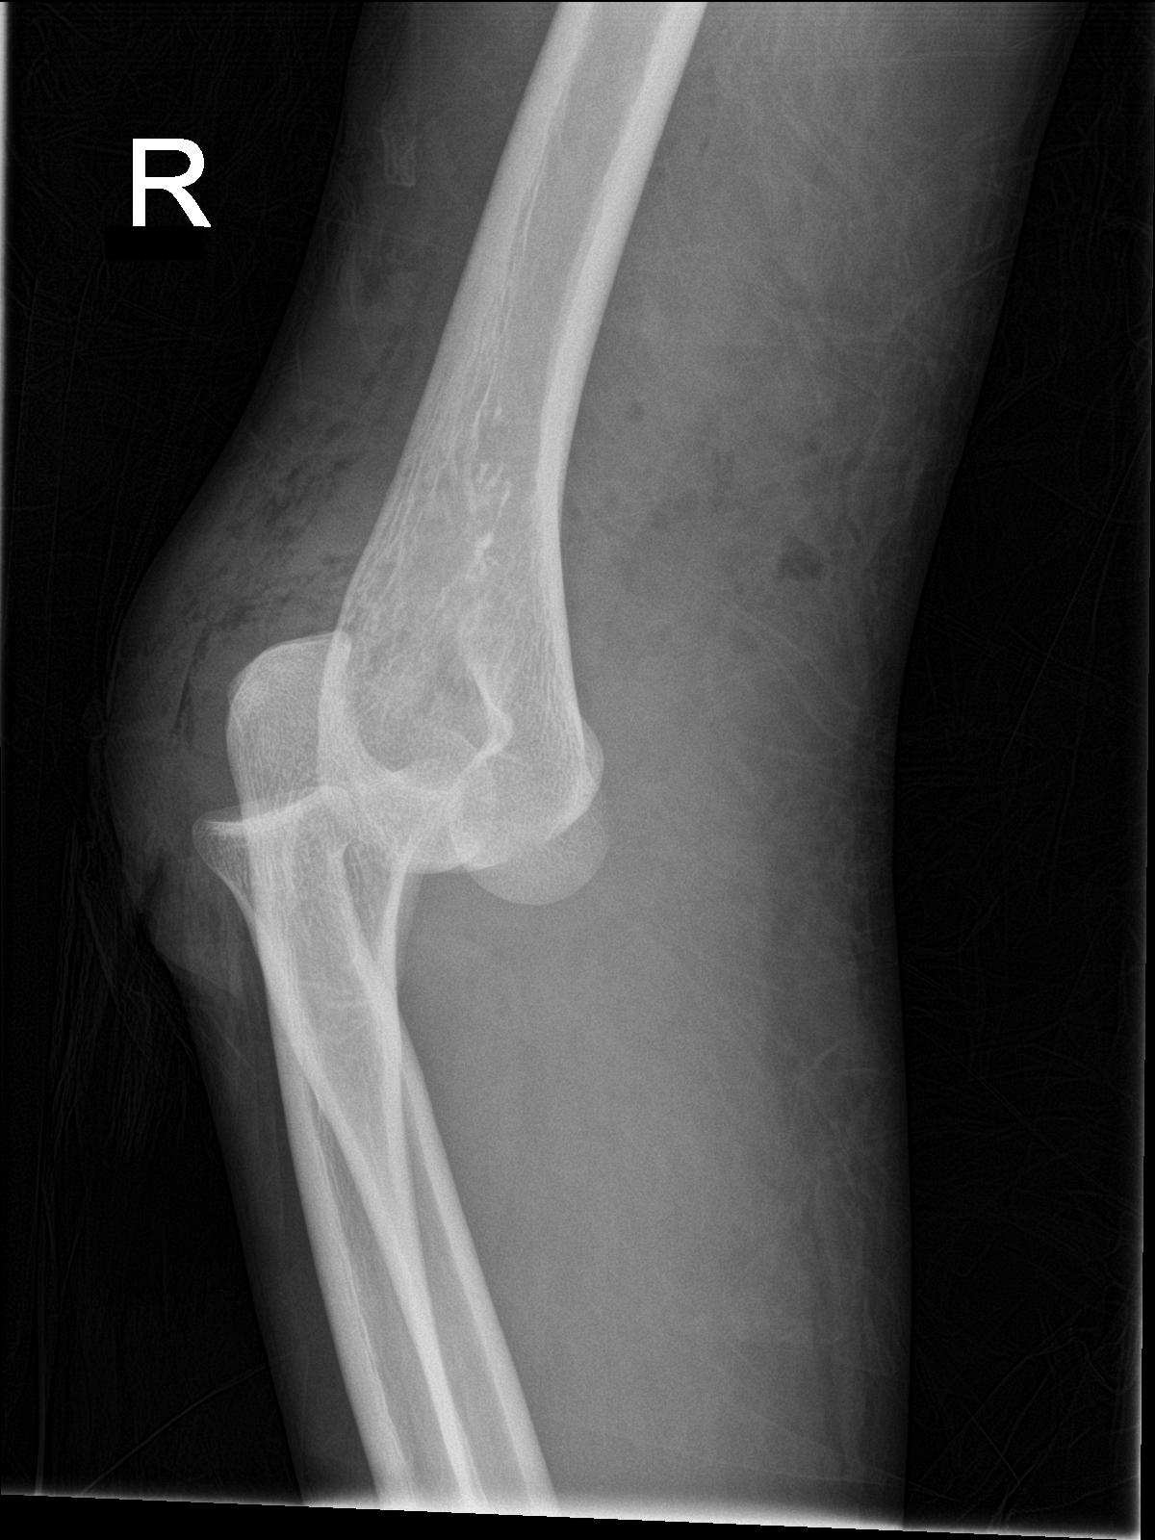
[im 2/3]
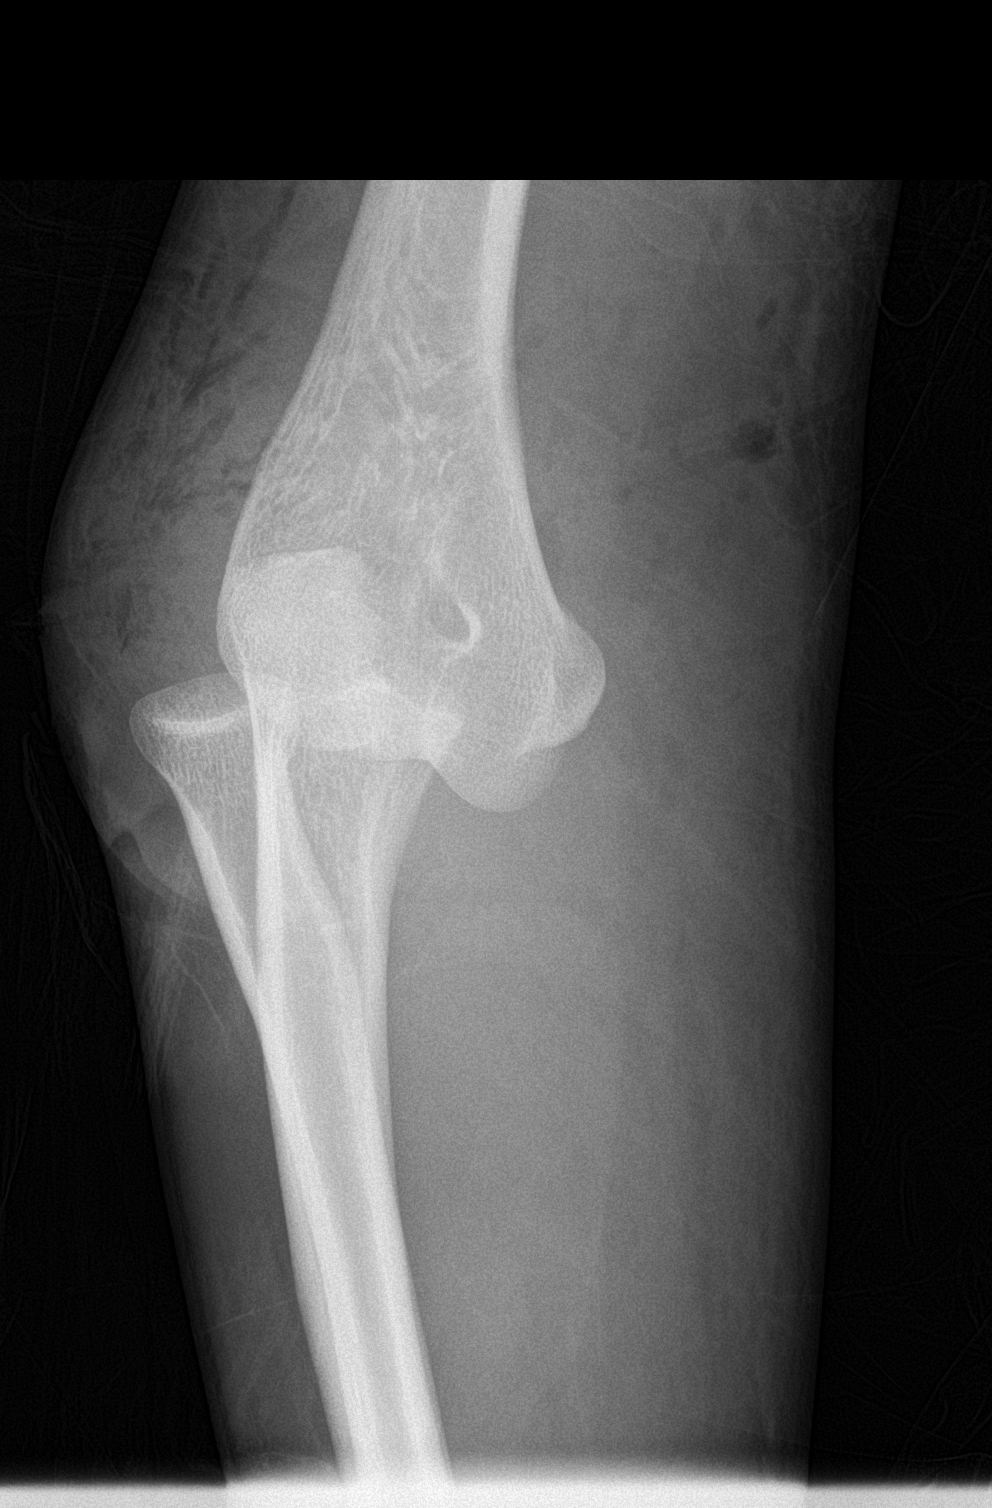
[im 3/3]
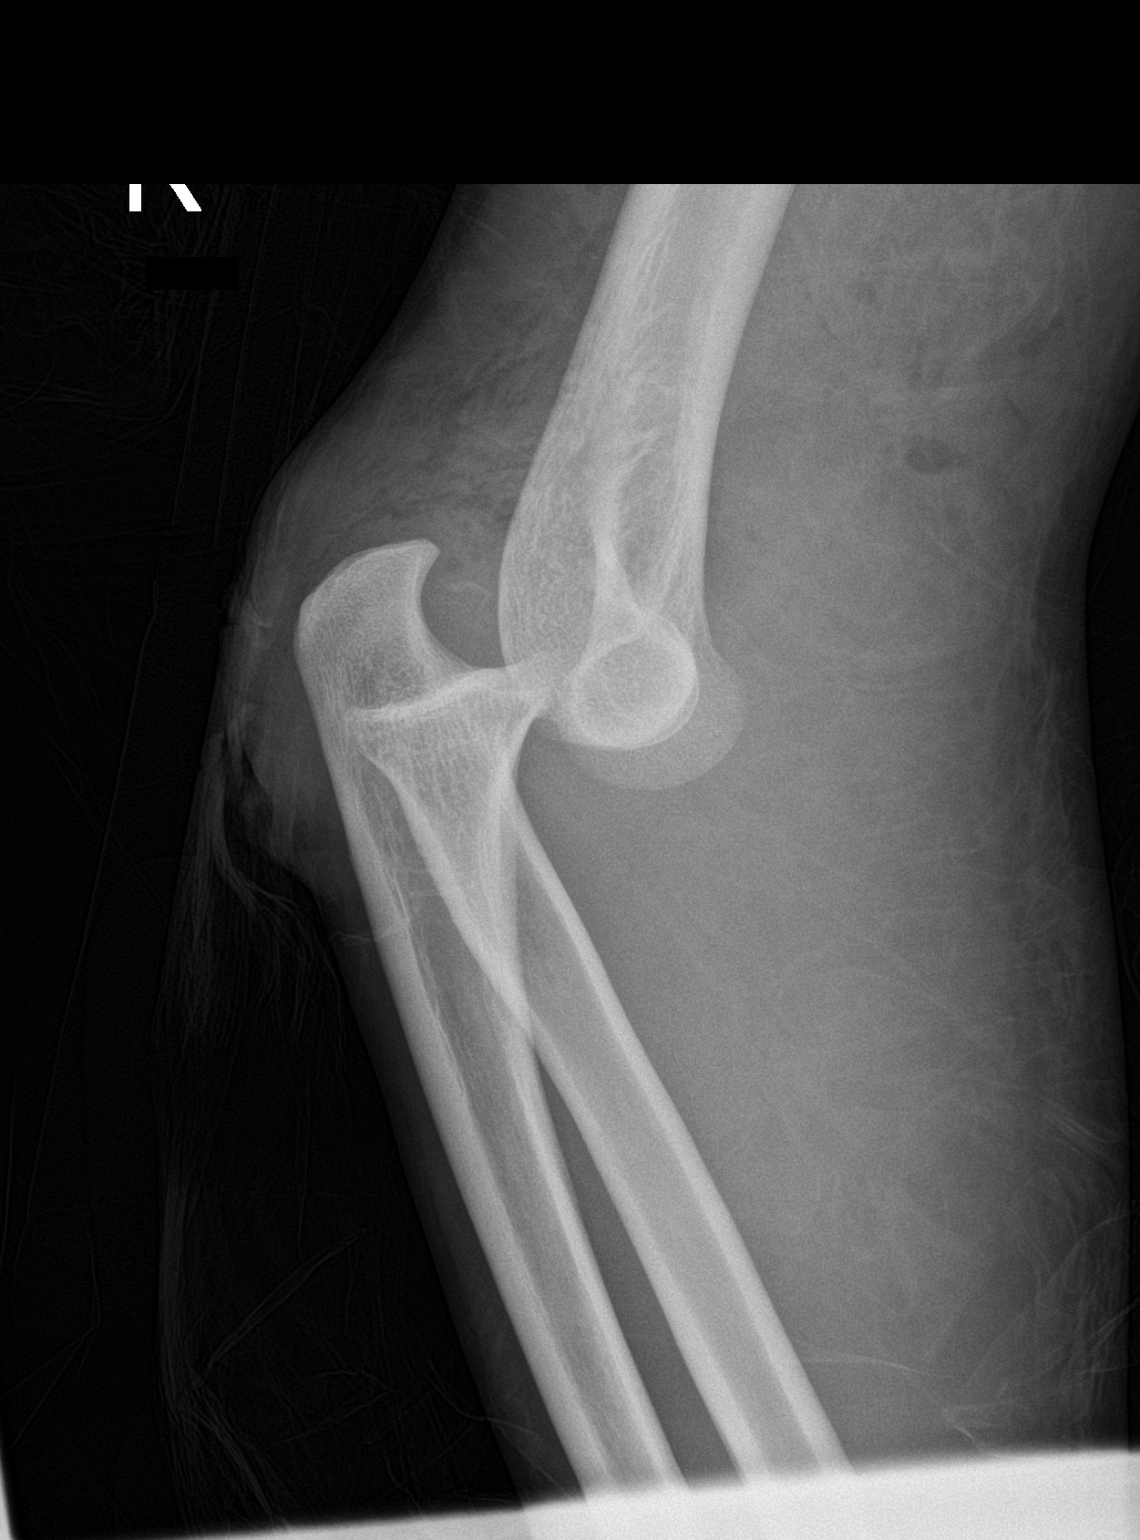

[3 of 3 positions shown; findings below may reference images not displayed]

FINDINGS: The right elbow is posteriorly dislocated. No fracture is
identified. Soft tissue swelling is noted.
IMPRESSION: Posterior dislocation right elbow.  No fracture is seen.

## 2019-08-25 IMAGING — DX DG FOREARM 2V*R*
1 series · 3 of 3 positions shown · non-contrast
Comparison: None.

CLINICAL DATA: The patient's right forearm was caught in a machine
and pulled. Pain and swelling. Initial encounter.

EXAM:
RIGHT FOREARM - 2 VIEW

[Series 1: forearmbone · 0.14mm/px · 3 of 3 slices shown]
[im 1/3]
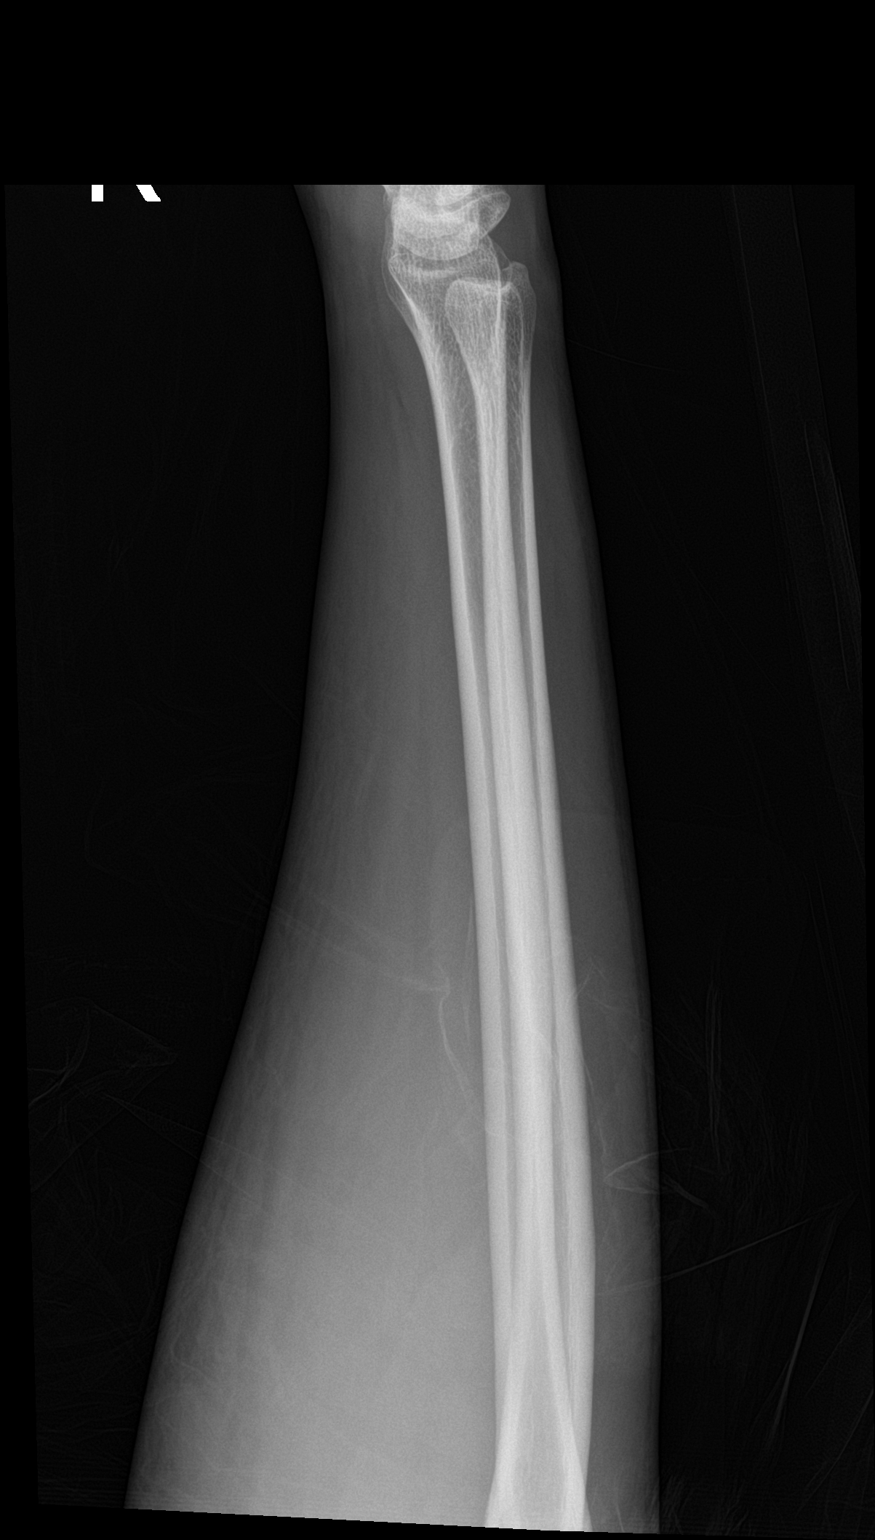
[im 2/3]
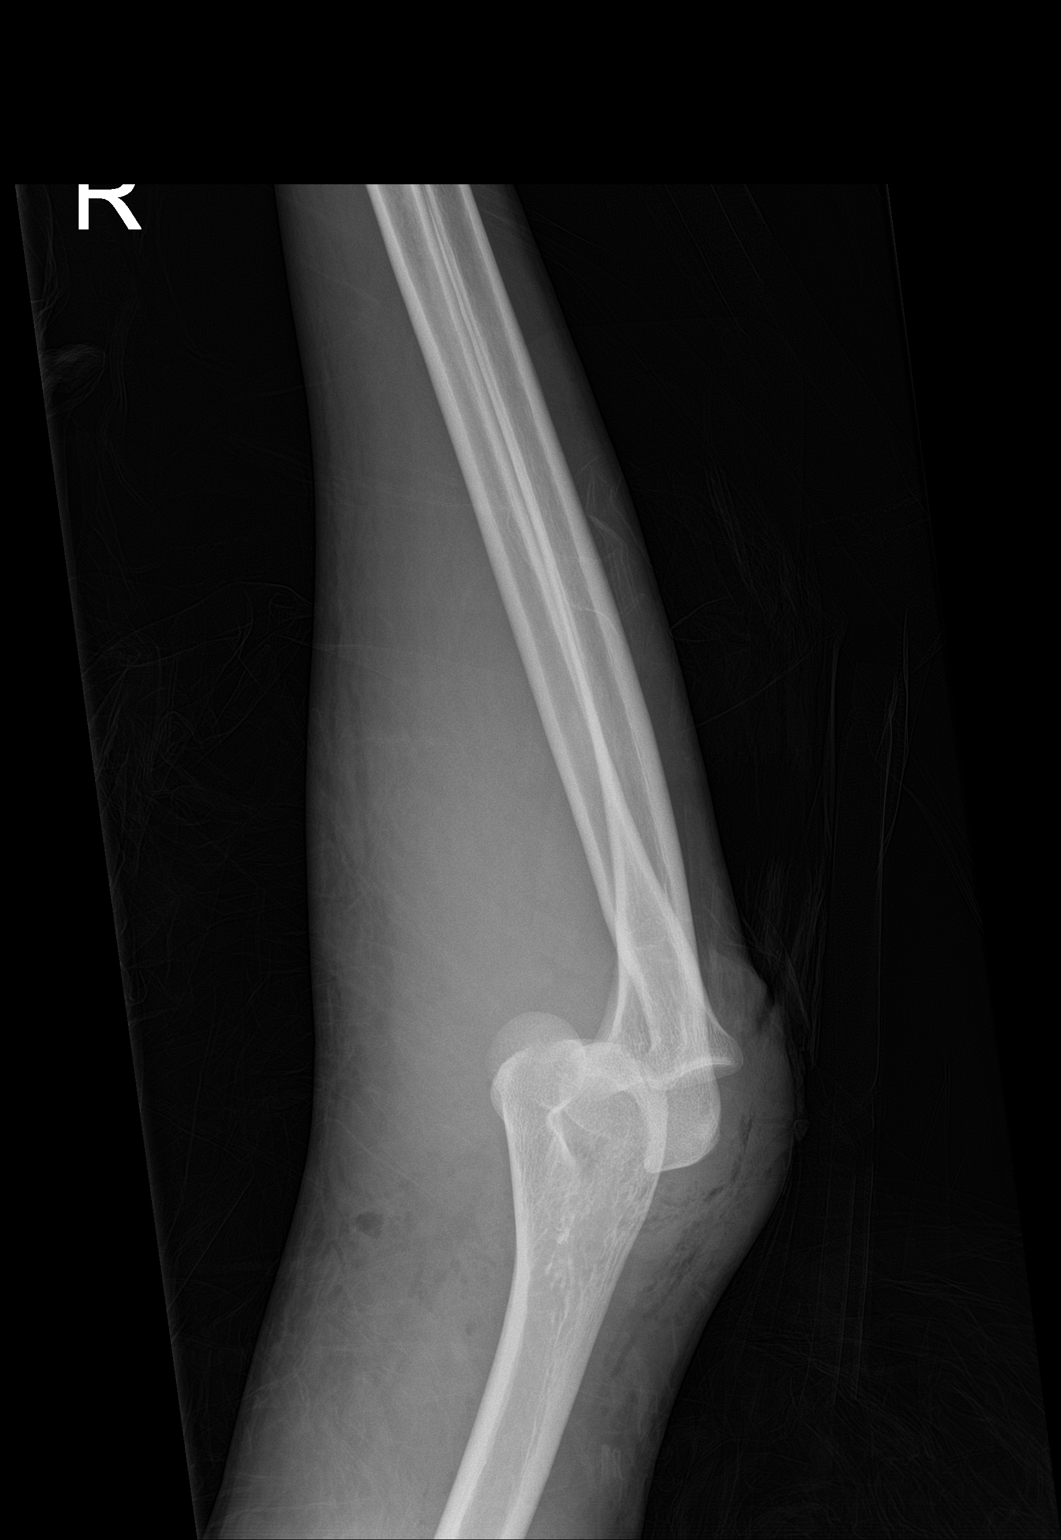
[im 3/3]
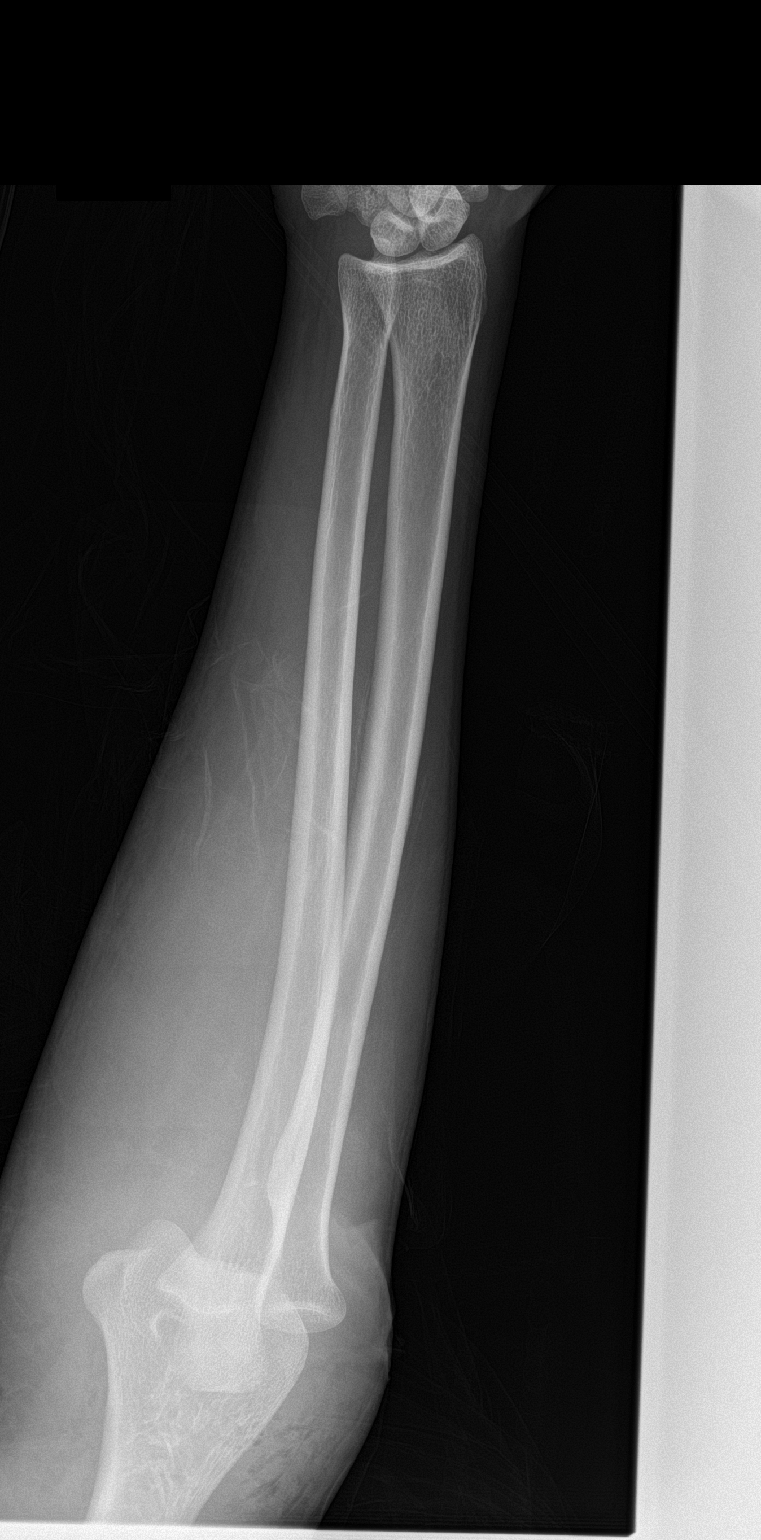

[3 of 3 positions shown; findings below may reference images not displayed]

FINDINGS: The right elbow is posteriorly dislocated with associated soft
tissue swelling. No fracture is identified.
IMPRESSION: Posterior dislocation right elbow.

## 2019-08-25 IMAGING — RF DG C-ARM 61-120 MIN
1 series · 7 of 7 positions shown · non-contrast
Comparison: 05/19/2017

CLINICAL DATA: Posterior dislocation, application of right elbow of
external fixator

EXAM:
RIGHT ELBOW - COMPLETE 3+ VIEW; DG C-ARM 61-120 MIN

[Series 1: run · 7 of 7 slices shown]
[im 1/7]
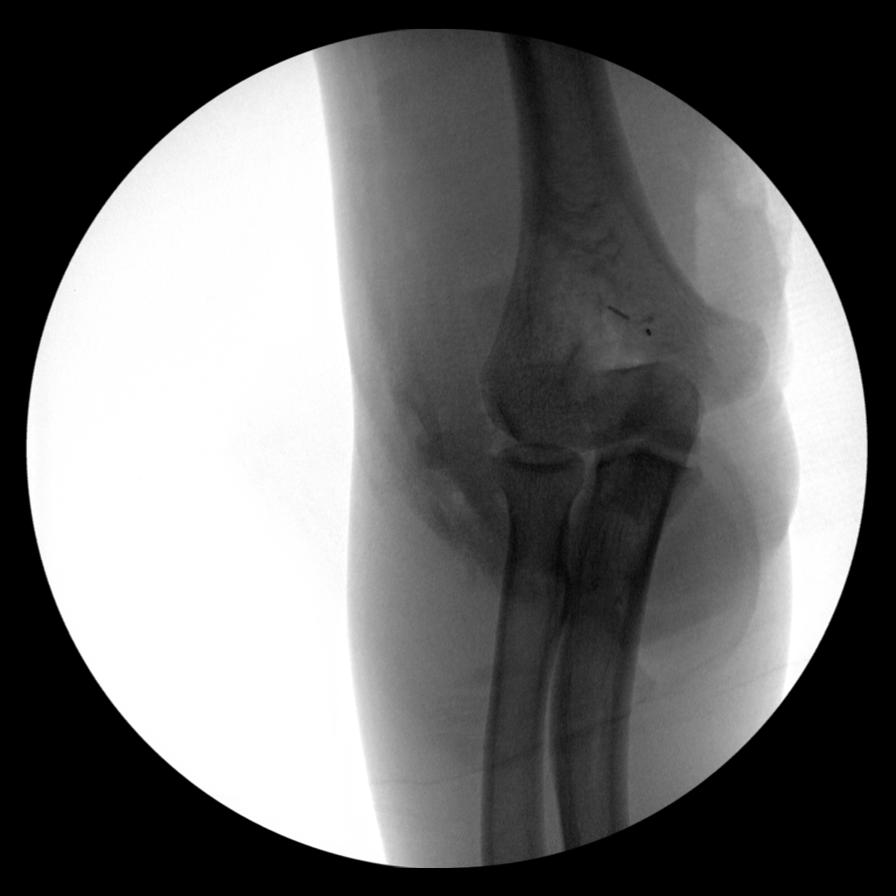
[im 2/7]
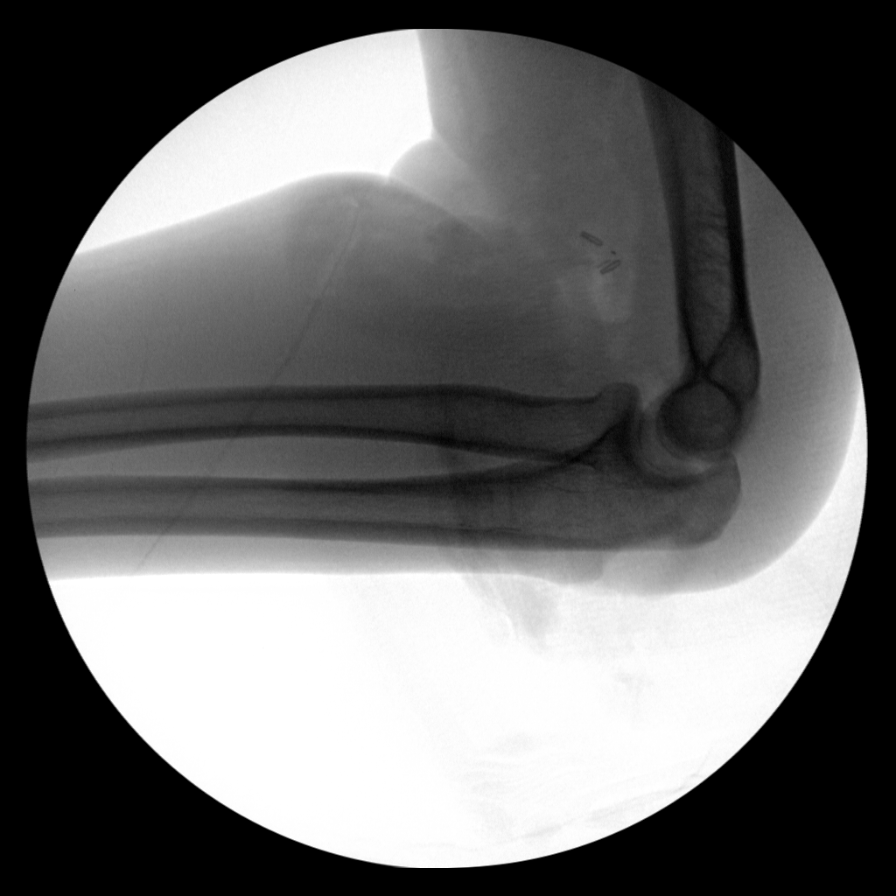
[im 3/7]
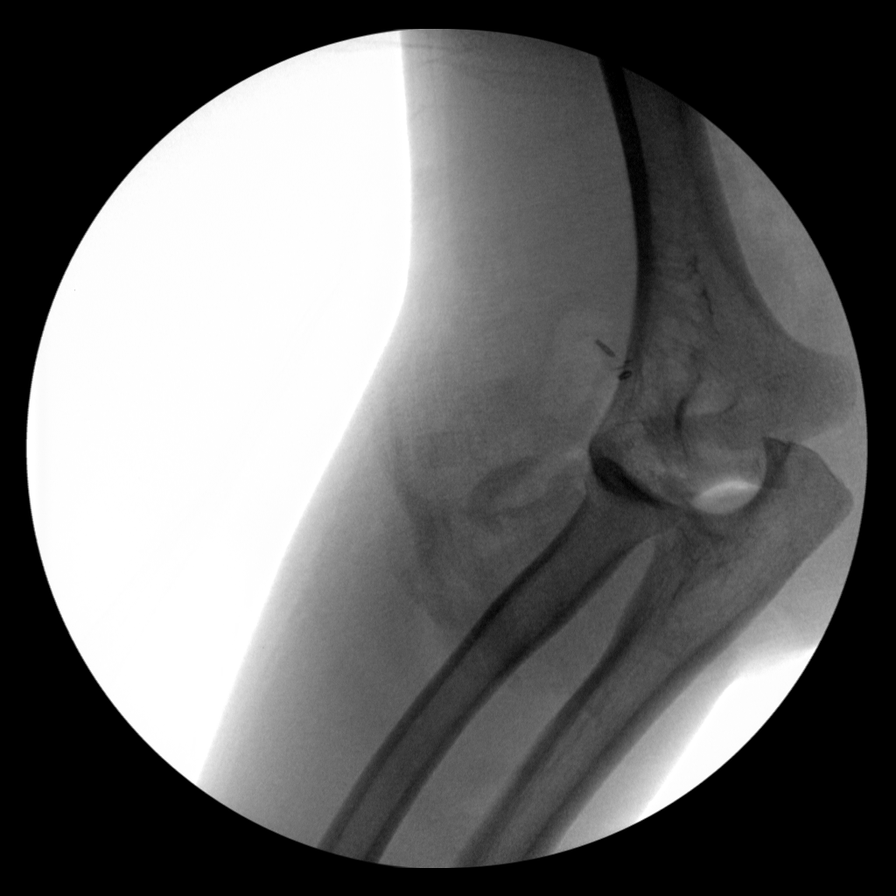
[im 4/7]
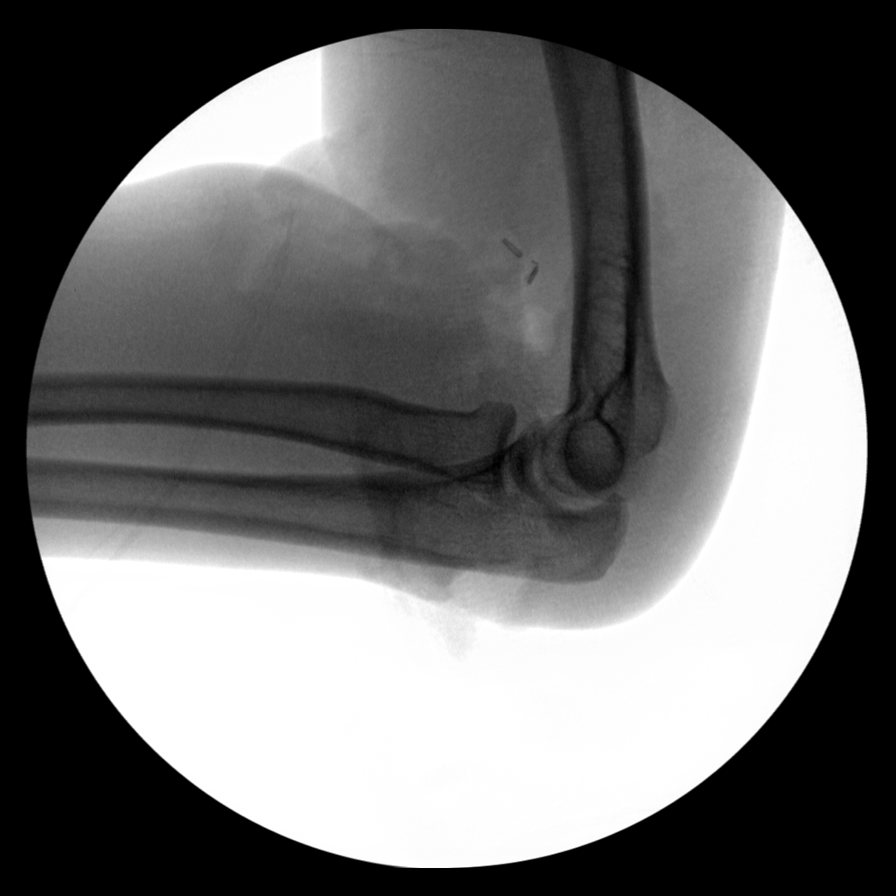
[im 5/7]
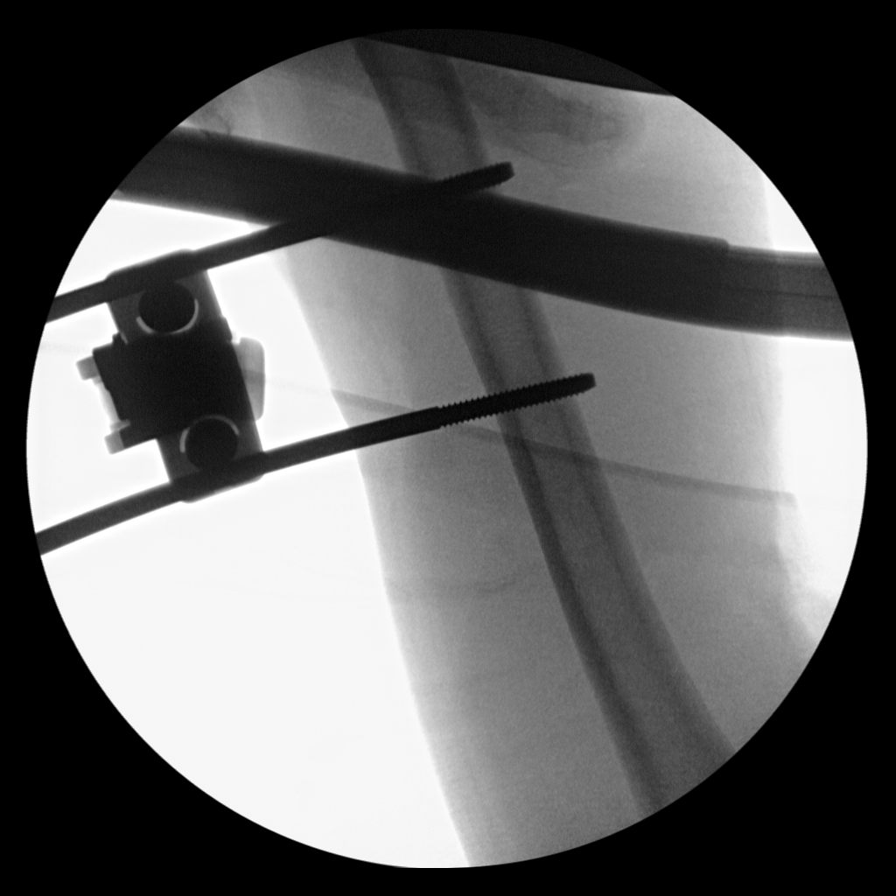
[im 6/7]
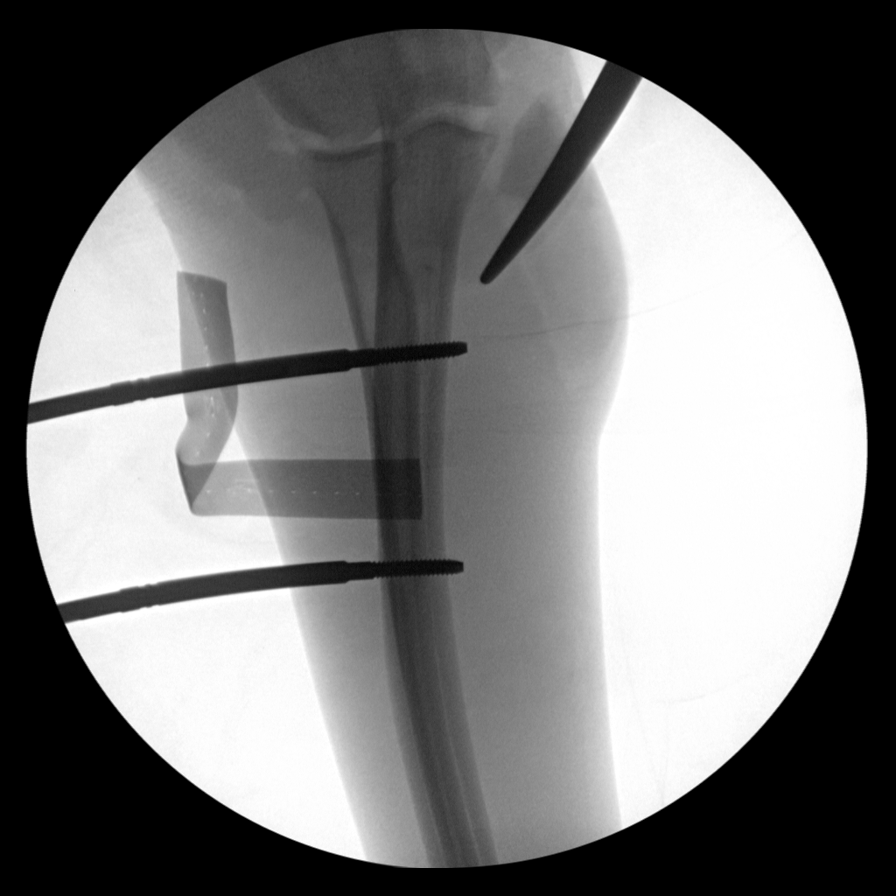
[im 7/7]
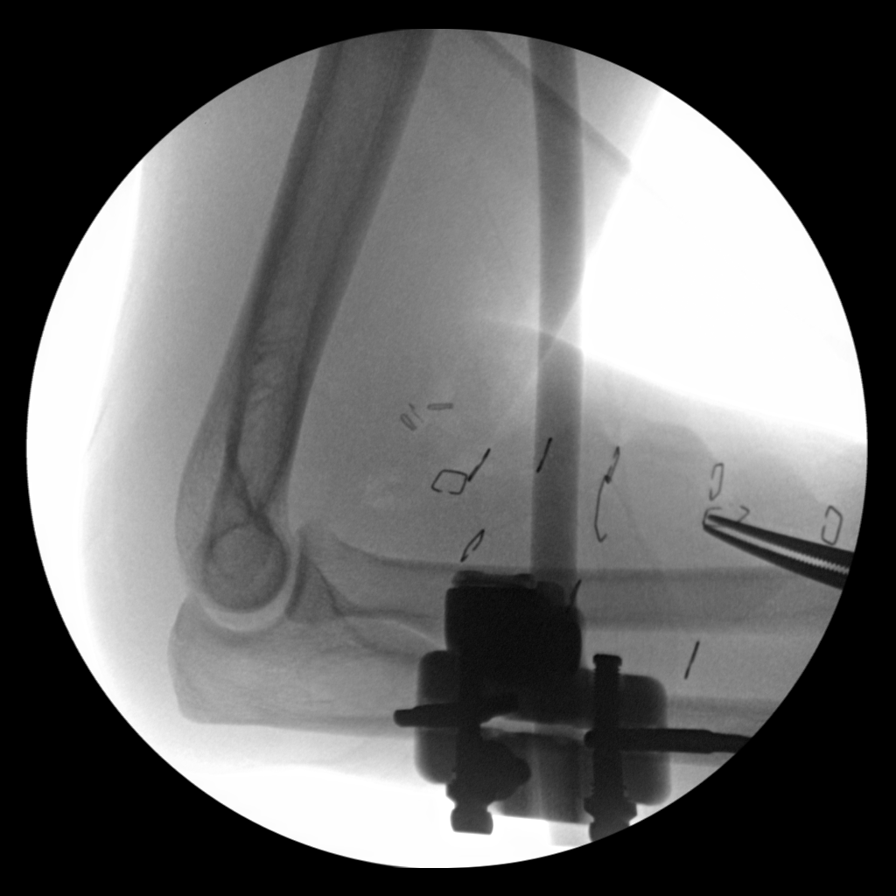

[7 of 7 positions shown; findings below may reference images not displayed]

FINDINGS: Limited spot fluoroscopic intraoperative imaging demonstrates
external fixator about the right elbow. Anatomic alignment flexed at
90 degrees. Postop changes of the soft tissues.
IMPRESSION: Limited imaging during placement of an external fixator about the
right elbow. Anatomic right elbow alignment.

## 2019-08-25 IMAGING — DX DG HUMERUS 2V *R*
1 series · 2 of 2 positions shown · non-contrast
Comparison: Right elbow x-rays obtained concurrently.

CLINICAL DATA: 51-year-old whose right arm was caught in a machine
and pulled, resulting in an injury to the right upper extremity.
Obvious deformity involving the elbow on physical examination.
Initial encounter.

EXAM:
RIGHT HUMERUS - 2+ VIEW

[Series 1: humerus · 0.14mm/px · 2 of 2 slices shown]
[im 1/2]
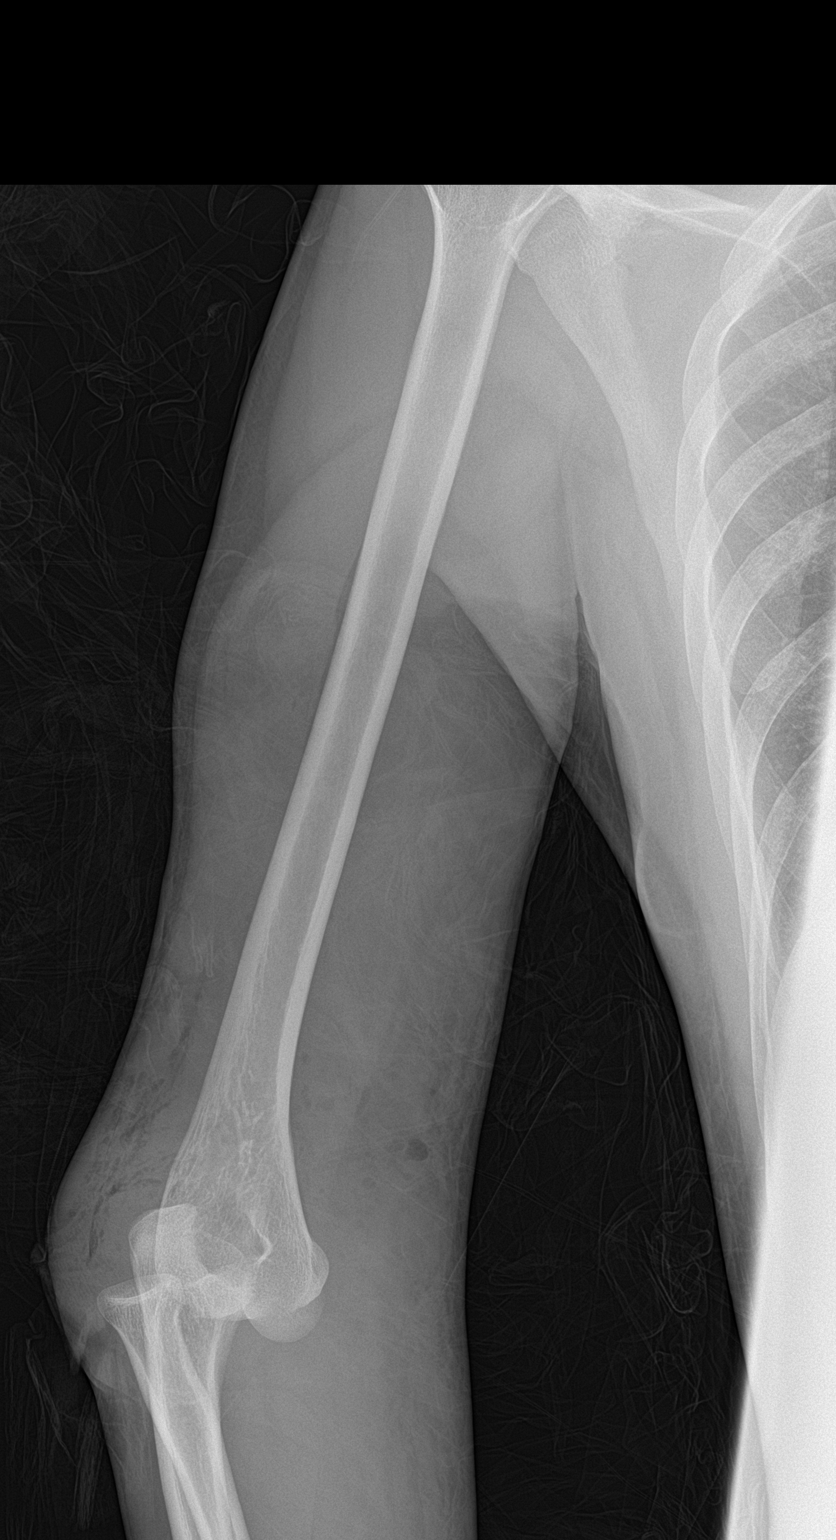
[im 2/2]
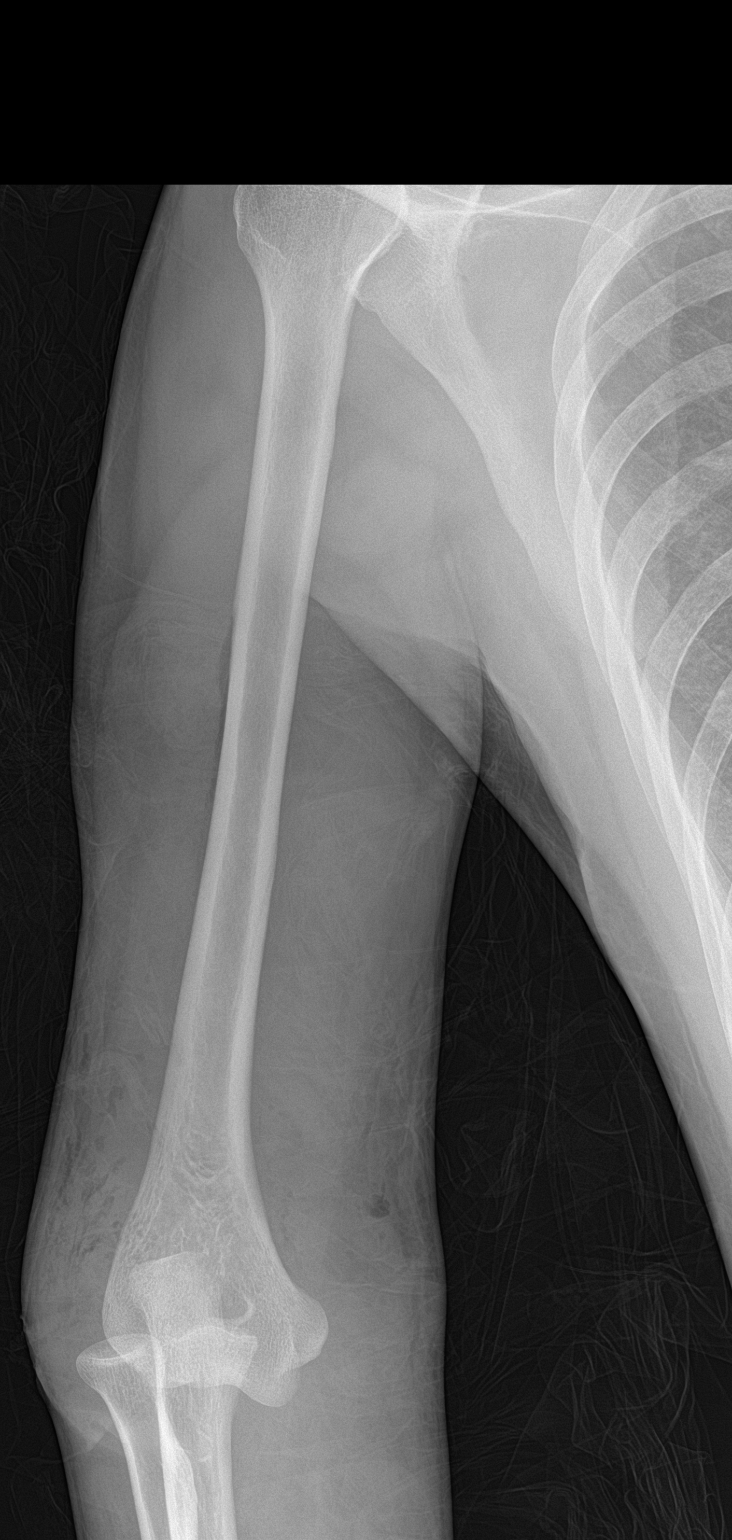

[2 of 2 positions shown; findings below may reference images not displayed]

FINDINGS: Fracture-dislocation involving the elbow as detailed on the
concurrent right elbow imaging. No fractures involving the humerus.
No intrinsic osseous abnormalities involving the humerus.
IMPRESSION: 1. Fracture-dislocation involving the right elbow as detailed on the
concurrent right elbow imaging.
2. No osseous abnormality involving the humerus.

## 2019-11-27 ENCOUNTER — Ambulatory Visit: Payer: Managed Care, Other (non HMO) | Attending: Internal Medicine

## 2019-11-27 DIAGNOSIS — Z23 Encounter for immunization: Secondary | ICD-10-CM

## 2019-11-27 NOTE — Progress Notes (Signed)
   Covid-19 Vaccination Clinic  Name:  Daniel Stewart    MRN: [id number] DOB: Jan 12, 1966  11/27/2019  Daniel Stewart was observed post Covid-19 immunization for 15 minutes without incident. He was provided with Vaccine Information Sheet and instruction to access the V-Safe system.   Daniel Stewart was instructed to call 911 with any severe reactions post vaccine: Marland Kitchen Difficulty breathing  . Swelling of face and throat  . A fast heartbeat  . A bad rash all over body  . Dizziness and weakness   Immunizations Administered    Name Date Dose VIS Date Route   Pfizer COVID-19 Vaccine 11/27/2019  3:55 PM 0.3 mL 08/11/2019 Intramuscular   Manufacturer: [employer], [employer]   Lot: [id number]   NDC: [address]-5

## 2019-12-20 ENCOUNTER — Ambulatory Visit: Payer: Managed Care, Other (non HMO) | Attending: Internal Medicine

## 2019-12-20 DIAGNOSIS — Z23 Encounter for immunization: Secondary | ICD-10-CM

## 2019-12-20 NOTE — Progress Notes (Signed)
   Covid-19 Vaccination Clinic  Name:  Daniel Stewart    MRN: [id number] DOB: September 26, 1965  12/20/2019  [name] was observed post Covid-19 immunization for 15 minutes without incident. He was provided with Vaccine Information Sheet and instruction to access the V-Safe system.   [name] was instructed to call 911 with any severe reactions post vaccine: Marland Kitchen Difficulty breathing  . Swelling of face and throat  . A fast heartbeat  . A bad rash all over body  . Dizziness and weakness   Immunizations Administered    Name Date Dose VIS Date Route   Pfizer COVID-19 Vaccine 12/20/2019  3:02 PM 0.3 mL 10/25/2018 Intramuscular   Manufacturer: [employer], [employer]   Lot: [id number]   NDC: [address]-9

## 2020-05-13 ENCOUNTER — Other Ambulatory Visit: Payer: Self-pay

## 2020-05-13 DIAGNOSIS — S55101A Unspecified injury of radial artery at forearm level, right arm, initial encounter: Secondary | ICD-10-CM

## 2020-05-24 ENCOUNTER — Ambulatory Visit (INDEPENDENT_AMBULATORY_CARE_PROVIDER_SITE_OTHER): Payer: Managed Care, Other (non HMO) | Admitting: Physician Assistant

## 2020-05-24 ENCOUNTER — Other Ambulatory Visit: Payer: Self-pay

## 2020-05-24 ENCOUNTER — Ambulatory Visit (HOSPITAL_COMMUNITY)
Admission: RE | Admit: 2020-05-24 | Discharge: 2020-05-24 | Disposition: A | Discharge status: Home / Self Care | Payer: Managed Care, Other (non HMO) | Source: Ambulatory Visit | Attending: Surgery | Admitting: Surgery

## 2020-05-24 VITALS — BP 137/83 | HR 86 | Temp 97.6°F | Resp 16 | Ht 67.0 in | Wt 120.0 lb

## 2020-05-24 DIAGNOSIS — S55101A Unspecified injury of radial artery at forearm level, right arm, initial encounter: Secondary | ICD-10-CM | POA: Diagnosis not present

## 2020-05-24 DIAGNOSIS — S45101A Unspecified injury of brachial artery, right side, initial encounter: Secondary | ICD-10-CM | POA: Diagnosis not present

## 2020-05-24 DIAGNOSIS — G8921 Chronic pain due to trauma: Secondary | ICD-10-CM | POA: Diagnosis not present

## 2020-05-24 NOTE — Progress Notes (Signed)
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  He is here today for surveillance of right UE  repair of the right brachial artery with interposition right greater saphenous vein graft by Dr. Myra Gianotti.   He had further procedures and fasciotomies performed by Dr. Jena Gauss.       Dr. Myra Gianotti has recommended aspirin for antiplatelet therapy as well as annual graft surveillance.   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       Past Medical History:  Diagnosis Date  . 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 Traumatic dislocation of right elbow 05/24/2017   Past Surgical History:  Procedure Laterality Date  . ARTERY REPAIR Right 05/19/2017   Procedure: BRACHIAL ARTERY REPAIR;  Surgeon: Nada Libman, MD;  Location: [facility] OR;  Service: Vascular;  Laterality: Right;  . EXTERNAL FIXATION ARM Right 05/19/2017   Procedure: EXTERNAL FIXATION ARM;  Surgeon: Roby Lofts, MD;  Location: [facility] OR;  Service: Orthopedics;  Laterality: Right;  . I & D EXTREMITY Right 05/19/2017   Procedure: IRRIGATION AND DEBRIDEMENT EXTREMITY/POSS. FAS/POSS. VAS;  Surgeon: Roby Lofts, MD;  Location: [facility] OR;  Service: Orthopedics;  Laterality: Right;  . SECONDARY CLOSURE OF WOUND Right 05/22/2017   Procedure: SECONDARY CLOSURE OF WOUND;  Surgeon: Roby Lofts, MD;  Location: [facility] OR;  Service: Orthopedics;  Laterality: Right;    Family History  Problem Relation Age of Onset  . Diabetes Mother   . Cancer Father     SOCIAL HISTORY: Social History   Socioeconomic History  . Marital status: Unknown    Spouse name: Not on file  . Number of children: Not on file  . Years of education: Not on file  . Highest education level: Not on file  Occupational History  . Not on file  Tobacco Use  . Smoking status: Former Smoker     Types: Cigarettes    Quit date: 06/29/2007    Years since quitting: 12.9  . Smokeless tobacco: Never Used  Substance and Sexual Activity  . Alcohol use: No    Alcohol/week: 0.0 standard drinks  . Drug use: No  . Sexual activity: Not on file  Other Topics Concern  . Not on file  Social History Narrative  . Not on file   Social Determinants of Health   Financial Resource Strain:   . Difficulty of Paying Living Expenses: Not on file  Food Insecurity:   . Worried About Programme researcher, broadcasting/film/video in the Last Year: Not on file  . Ran Out of Food in the Last Year: Not on file  Transportation Needs:   . Lack of Transportation (Medical): Not on file  . Lack of Transportation (Non-Medical): Not on file  Physical Activity:   . Days of Exercise per Week: Not on file  . Minutes of Exercise per Session: Not on file  Stress:   . Feeling of Stress : Not on file  Social Connections:   . Frequency of Communication with Friends and Family: Not on file  . Frequency of Social Gatherings with Friends and Family: Not on file  . Attends Religious Services: Not on file  . Active Member of Clubs or Organizations: Not on file  . Attends Banker Meetings: Not on file  . Marital Status: Not on  file  Intimate Partner Violence:   . Fear of Current or Ex-Partner: Not on file  . Emotionally Abused: Not on file  . Physically Abused: Not on file  . Sexually Abused: Not on file    No Known Allergies  Current Outpatient Medications  Medication Sig Dispense Refill  . aspirin 81 MG chewable tablet Chew 1 tablet (81 mg total) by mouth daily. 60 tablet 1  . clobetasol cream (TEMOVATE) 0.05 % Apply 1 application topically daily as needed (for toes).    . gabapentin (NEURONTIN) 300 MG capsule Take 800 mg by mouth 5 (five) times daily.     . Multiple Vitamin (MULTIVITAMIN WITH MINERALS) TABS tablet Take 1 tablet by mouth daily as needed (for vitamin).    . traMADol (ULTRAM) 50 MG tablet Take 50 mg by  mouth every 8 (eight) hours as needed.    . docusate sodium (COLACE) 100 MG capsule Take 1 capsule (100 mg total) by mouth 2 (two) times daily. (Patient not taking: Reported on 05/24/2020) 30 capsule 0  . ketorolac (TORADOL) 10 MG tablet Take 1 tablet (10 mg total) by mouth every 6 (six) hours as needed for moderate pain. (Patient not taking: Reported on 05/24/2020) 20 tablet 0  . methocarbamol (ROBAXIN) 500 MG tablet Take 1-2 tablets (500-1,000 mg total) by mouth every 6 (six) hours as needed for muscle spasms. (Patient not taking: Reported on 03/01/2019) 60 tablet 0  . omeprazole (PRILOSEC) 20 MG capsule Take 20 mg by mouth daily as needed (for acid). (Patient not taking: Reported on 05/24/2020)    . oxyCODONE (OXY IR/ROXICODONE) 5 MG immediate release tablet Take 1-2 tablets (5-10 mg total) by mouth every 6 (six) hours as needed for breakthrough pain (take between percocet for breakthrough pain only). (Patient not taking: Reported on 03/01/2019) 20 tablet 0  . oxyCODONE-acetaminophen (PERCOCET) 5-325 MG tablet Take 1-2 tablets by mouth every 6 (six) hours as needed for moderate pain or severe pain. (Patient not taking: Reported on 03/01/2019) 56 tablet 0  . polyethylene glycol (MIRALAX / GLYCOLAX) packet Take 17 g by mouth daily. (Patient not taking: Reported on 05/24/2020) 14 each 0   No current facility-administered medications for this visit.    ROS:   General:  No weight loss, Fever, chills  HEENT: No recent headaches, no nasal bleeding, no visual changes, no sore throat  Neurologic: No dizziness, blackouts, seizures. No recent symptoms of stroke or mini- stroke. No recent episodes of slurred speech, or temporary blindness.  Cardiac: No recent episodes of chest pain/pressure, no shortness of breath at rest.  No shortness of breath with exertion.  Denies history of atrial fibrillation or irregular heartbeat  Vascular: No history of rest pain in feet.  No history of claudication.  No history of  non-healing ulcer, No history of DVT   Pulmonary: No home oxygen, no productive cough, no hemoptysis,  No asthma or wheezing  Musculoskeletal: [] Arthritis, [] Low back pain,  [x]  Joint pain  Hematologic:No history of hypercoagulable state.  No history of easy bleeding.  No history of anemia  Gastrointestinal: No hematochezia or melena,  No gastroesophageal reflux, no trouble swallowing  Urinary: []  chronic Kidney disease, []  on HD - []  MWF or []  TTHS, [ ]  Burning with urination, [ ]  Frequent urination, [ ]  Difficulty urinating;   Skin: No rashes  Psychological: No history of anxiety,  No history of depression   Physical Examination  Vitals:   05/24/20 1043  BP: 137/83  Pulse: 86  Resp: 16  Temp: 97.6 F (36.4 C)  TempSrc: Temporal  SpO2: 100%  Weight: 120 lb (54.4 kg)  Height: 5\' 7"  (1.702 m)    Body mass index is 18.79 kg/m.  General:  Alert and oriented, no acute distress HEENT: Normal Neck: No bruit or JVD Pulmonary: Clear to auscultation bilaterally Cardiac: Regular Rate and Rhythm without murmur Abdomen: Soft, non-tender, non-distended, no mass, no scars Skin: No rash, multiple right UE well healed scars Extremity Pulses:  2+ radial Musculoskeletal: Right elbow 80-90 degree contracture.without edema, grip 4-/5 in right hand  Neurologic: left Upper and B lower extremity motor 5/5 and symmetric  DATA:     Right Doppler Findings:  +---------------+----------+--------+--------+--------+  Site      PSV (cm/s)WaveformStenosisComments  +---------------+----------+--------+--------+--------+  Subclavian Prox118    biphasic          +---------------+----------+--------+--------+--------+  Subclavian Dist80    biphasic          +---------------+----------+--------+--------+--------+  Axillary    63    biphasic          +---------------+----------+--------+--------+--------+  Brachial Prox 64     biphasic          +---------------+----------+--------+--------+--------+  Brachial Mid  100    biphasic          +---------------+----------+--------+--------+--------+  Brachial Dist 46    biphasic          +---------------+----------+--------+--------+--------+  Radial Prox  42    biphasic          +---------------+----------+--------+--------+--------+  Radial Mid   50    biphasic          +---------------+----------+--------+--------+--------+  Radial Dist  50    biphasic          +---------------+----------+--------+--------+--------+  Ulnar Prox   34    biphasic          +---------------+----------+--------+--------+--------+  Ulnar Dist   37    biphasic          +---------------+----------+--------+--------+--------+      Right Graft #1:  +------------------+--------+---------------+---------+--------+           PSV cm/sStenosis    Waveform Comments  +------------------+--------+---------------+---------+--------+  Inflow      79           biphasic       +------------------+--------+---------------+---------+--------+  Prox Anastomosis 291   50-70% stenosistriphasic      +------------------+--------+---------------+---------+--------+  Proximal Graft  185           biphasic       +------------------+--------+---------------+---------+--------+  Mid Graft     146           biphasic       +------------------+--------+---------------+---------+--------+  Distal Graft   47           biphasic       +------------------+--------+---------------+---------+--------+  Distal Anastomosis49           triphasic      +------------------+--------+---------------+---------+--------+  Outflow       44           biphasic       +------------------+--------+---------------+---------+--------+     Summary:    Right: No obstruction visualized in the right upper extremity     Velocities suggest >50% stenosis at the proximal anastomosis     of the graft, however elevated velocities may reflect the     change in diameter of the vessel. Elevated velocities are     seen at the proximal anastomosis, consistent with prior     exams.  ASSESSMENT:   Trauma causing right brachial  artery transection s/p GSV interposition bypass graft and thrombectomy of right brachial artery.   His arterial flow is patent with palpable radial pulse and ABI biphasic flow.  He continues to have proximal stenosis that is not flow limiting and has not changed significantly from his previous Duplex.    Stable artrial flow to the right UE with patent bypass.  PLAN:    He will continue daily activity as tolerates with his right UE.  Continue 81 mg Asa antiplatelet therapy daily for life.  F/U in 1 year for repeat arterial duplex.  If he developed a cold hand loss of what motor he has currently or a non healing wound he will call sooner.   Mosetta PigeonEmma Maureen Kohei Antonellis PA-C [facility] [place] Office: [phone]  MD in clinic [place]

## 2021-02-01 ENCOUNTER — Other Ambulatory Visit: Payer: Self-pay

## 2021-02-01 DIAGNOSIS — S45101A Unspecified injury of brachial artery, right side, initial encounter: Secondary | ICD-10-CM

## 2021-05-26 ENCOUNTER — Ambulatory Visit (INDEPENDENT_AMBULATORY_CARE_PROVIDER_SITE_OTHER): Payer: Managed Care, Other (non HMO) | Admitting: Physician Assistant

## 2021-05-26 ENCOUNTER — Other Ambulatory Visit: Payer: Self-pay

## 2021-05-26 ENCOUNTER — Ambulatory Visit (HOSPITAL_COMMUNITY)
Admission: RE | Admit: 2021-05-26 | Discharge: 2021-05-26 | Disposition: A | Discharge status: Home / Self Care | Payer: Managed Care, Other (non HMO) | Source: Ambulatory Visit | Attending: Surgery | Admitting: Surgery

## 2021-05-26 VITALS — BP 147/90 | HR 97 | Temp 98.6°F | Resp 20 | Ht 67.0 in | Wt 123.6 lb

## 2021-05-26 DIAGNOSIS — G8921 Chronic pain due to trauma: Secondary | ICD-10-CM

## 2021-05-26 DIAGNOSIS — S45101A Unspecified injury of brachial artery, right side, initial encounter: Secondary | ICD-10-CM

## 2021-05-26 NOTE — Progress Notes (Signed)
[facility] [place] HISTORY AND PHYSICAL   History of Present Illness:  Patient is a 55 y.o. 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 S/P right UE  repair of the right brachial artery with interposition right greater saphenous vein graft by Dr. Myra Gianotti on 05/19/2017.   He had further procedures and fasciotomies performed by Dr. Jena Gauss.  He has right elbow contracture and continued numbness and tingling with pain.  He takes Gabapentin, PRN tramadol and has completed PT.  He is at a point where he can live with how his arm is.    He denise non healing wounds and rest pain that is uncontrollable.    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 FAS/POSS. VAS;  Surgeon: Roby Lofts, MD;  Location: [facility] OR;  Service: Orthopedics;  Laterality: Right;   SECONDARY CLOSURE OF WOUND Right 05/22/2017   Procedure: SECONDARY CLOSURE OF WOUND;  Surgeon: Roby Lofts, MD;  Location: [facility] OR;  Service: Orthopedics;  Laterality: Right;    ROS:   General:  No weight loss, Fever, chills  HEENT: No recent headaches, no nasal bleeding, no visual changes, no sore throat  Neurologic: No dizziness, blackouts, seizures. No recent symptoms of stroke or mini- stroke. No recent episodes of slurred speech, or temporary blindness.  Cardiac: No recent episodes of  chest pain/pressure, no shortness of breath at rest.  No shortness of breath with exertion.  Denies history of atrial fibrillation or irregular heartbeat  Vascular: No history of rest pain in feet.  No history of claudication.  No history of non-healing ulcer, No history of DVT   Pulmonary: No home oxygen, no productive cough, no hemoptysis,  No asthma or wheezing  Musculoskeletal:  [ ]  Arthritis, [ ]  Low back pain,  [ ]  Joint pain  Hematologic:No history of hypercoagulable state.  No history of easy bleeding.  No history of anemia  Gastrointestinal: No hematochezia or melena,  No gastroesophageal reflux, no trouble swallowing  Urinary: [ ]  chronic Kidney disease, [ ]  on HD - [ ]  MWF or [ ]  TTHS, [ ]  Burning with urination, [ ]  Frequent urination, [ ]  Difficulty urinating;   Skin: No rashes  Psychological: No history of anxiety,  No history of depression  Social History Social History   Tobacco Use   Smoking status: Former    Types: Cigarettes    Quit date: 06/29/2007    Years since quitting: 13.9   Smokeless tobacco: Never  Substance Use Topics   Alcohol use: No    Alcohol/week: 0.0 standard drinks   Drug use: No    Family History Family History  Problem Relation Age of Onset   Diabetes Mother    Cancer Father     Allergies  No Known Allergies   Current Outpatient Medications  Medication Sig Dispense Refill   aspirin  81 MG chewable tablet Chew 1 tablet (81 mg total) by mouth daily. 60 tablet 1   gabapentin (NEURONTIN) 800 MG tablet Take by mouth.     traMADol (ULTRAM) 50 MG tablet Take 50 mg by mouth every 8 (eight) hours as needed.     clobetasol cream (TEMOVATE) 0.05 % Apply 1 application topically daily as needed (for toes). (Patient not taking: Reported on 05/26/2021)     No current facility-administered medications for this visit.    Physical Examination  Vitals:   05/26/21 1048  BP: (!) 147/90  Pulse: 97  Resp: 20  Temp: 98.6 F (37 C)  TempSrc:  Temporal  SpO2: 98%  Weight: 123 lb 9.6 oz (56.1 kg)  Height: 5\' 7"  (1.702 m)    Body mass index is 19.36 kg/m.  General:  Alert and oriented, no acute distress HEENT: Normal Neck: No bruit or JVD Pulmonary: Clear to auscultation bilaterally Cardiac: Regular Rate and Rhythm without murmur Abdomen: Soft, non-tender, non-distended, no mass, no scars Skin: No rash Extremity Pulses:  2+ radial pulses bilaterally Musculoskeletal: No deformity or edema  Neurologic: Right Upper ext. Grip 4/5  DATA:      Right Doppler Findings:  +---------------+----------+--------+--------+--------+  Site           PSV (cm/s)WaveformStenosisComments  +---------------+----------+--------+--------+--------+  Subclavian Prox125       biphasic                  +---------------+----------+--------+--------+--------+  Subclavian Dist68        biphasic                  +---------------+----------+--------+--------+--------+  Axillary       60        biphasic                  +---------------+----------+--------+--------+--------+  Brachial Prox  60        biphasic                  +---------------+----------+--------+--------+--------+  Brachial Mid   81        biphasic                  +---------------+----------+--------+--------+--------+  Brachial Dist  72        biphasic                  +---------------+----------+--------+--------+--------+  Radial Prox    35        biphasic                  +---------------+----------+--------+--------+--------+  Radial Mid     38        biphasic                  +---------------+----------+--------+--------+--------+  Radial Dist    41        biphasic                  +---------------+----------+--------+--------+--------+  Ulnar Prox     38        biphasic                  +---------------+----------+--------+--------+--------+  Ulnar Mid      37        biphasic                   +---------------+----------+--------+--------+--------+  Ulnar Dist     44        biphasic                  +---------------+----------+--------+--------+--------+  Summary:     Right: Patent right upper extremity arterial system. Increased         velocities seen on previous exams were not identified on         today's exam.   ASSESSMENT:  Traumatic injury to right UE s/p  right UE  repair of the right brachial artery with interposition right greater saphenous vein graft by Dr. Myra Gianotti.  His bypass is open with biphasic flow.  He is satisfied with his progress and he is thankful that he still has a right arm.     PLAN: He has a palpable radial pulse and is stable with good inflow.  If he develops increased pain, non healing wound or ischemic changes to his right hand he will call.  He will f/u PRN.   Continue 81 mg Asa antiplatelet therapy daily for life.  Mosetta Pigeon PA-C [facility] [place] Office: [phone]  MD in clinic [place]
